# Patient Record
Sex: Female | Born: 1970
Health system: Southern US, Community
[De-identification: ages and names within clinical notes are randomized; demographics above are authoritative.]

## PROBLEM LIST (undated history)

## (undated) DIAGNOSIS — K589 Irritable bowel syndrome without diarrhea: Secondary | ICD-10-CM

## (undated) DIAGNOSIS — I10 Essential (primary) hypertension: Secondary | ICD-10-CM

## (undated) DIAGNOSIS — E079 Disorder of thyroid, unspecified: Secondary | ICD-10-CM

## (undated) HISTORY — DX: Disorder of thyroid, unspecified: E07.9

## (undated) HISTORY — PX: LEG SURGERY: SHX1003

---

## 2002-05-08 ENCOUNTER — Emergency Department (HOSPITAL_COMMUNITY): Admission: EM | Admit: 2002-05-08 | Discharge: 2002-05-08 | Payer: Self-pay | Admitting: Emergency Medicine

## 2003-04-22 ENCOUNTER — Other Ambulatory Visit: Admission: RE | Admit: 2003-04-22 | Discharge: 2003-04-22 | Payer: Self-pay | Admitting: Obstetrics and Gynecology

## 2003-09-02 ENCOUNTER — Inpatient Hospital Stay (HOSPITAL_COMMUNITY): Admission: AD | Admit: 2003-09-02 | Discharge: 2003-09-08 | Payer: Self-pay | Admitting: Obstetrics and Gynecology

## 2003-09-02 ENCOUNTER — Encounter: Payer: Self-pay | Admitting: Obstetrics and Gynecology

## 2003-09-04 ENCOUNTER — Encounter: Payer: Self-pay | Admitting: Obstetrics & Gynecology

## 2003-09-04 ENCOUNTER — Encounter (INDEPENDENT_AMBULATORY_CARE_PROVIDER_SITE_OTHER): Payer: Self-pay | Admitting: *Deleted

## 2003-10-03 ENCOUNTER — Other Ambulatory Visit: Admission: RE | Admit: 2003-10-03 | Discharge: 2003-10-03 | Payer: Self-pay | Admitting: *Deleted

## 2005-03-26 ENCOUNTER — Emergency Department (HOSPITAL_COMMUNITY): Admission: EM | Admit: 2005-03-26 | Discharge: 2005-03-27 | Payer: Self-pay | Admitting: Emergency Medicine

## 2006-03-24 ENCOUNTER — Ambulatory Visit: Payer: Self-pay | Admitting: Internal Medicine

## 2006-03-28 ENCOUNTER — Ambulatory Visit: Payer: Self-pay | Admitting: Family Medicine

## 2006-04-11 ENCOUNTER — Ambulatory Visit: Payer: Self-pay | Admitting: Family Medicine

## 2006-05-09 ENCOUNTER — Encounter (INDEPENDENT_AMBULATORY_CARE_PROVIDER_SITE_OTHER): Payer: Self-pay | Admitting: Family Medicine

## 2006-05-09 LAB — CONVERTED CEMR LAB
RBC count: 4.81 10*6/uL
TSH: 3.743 microintl units/mL
WBC, blood: 9 10*3/uL

## 2006-05-23 ENCOUNTER — Ambulatory Visit: Payer: Self-pay | Admitting: Family Medicine

## 2006-06-07 ENCOUNTER — Encounter (INDEPENDENT_AMBULATORY_CARE_PROVIDER_SITE_OTHER): Payer: Self-pay | Admitting: Family Medicine

## 2006-06-20 ENCOUNTER — Ambulatory Visit: Payer: Self-pay | Admitting: Family Medicine

## 2006-09-19 ENCOUNTER — Ambulatory Visit: Payer: Self-pay | Admitting: Family Medicine

## 2006-10-16 ENCOUNTER — Encounter: Payer: Self-pay | Admitting: Family Medicine

## 2006-10-16 DIAGNOSIS — F329 Major depressive disorder, single episode, unspecified: Secondary | ICD-10-CM | POA: Insufficient documentation

## 2006-10-16 DIAGNOSIS — E785 Hyperlipidemia, unspecified: Secondary | ICD-10-CM | POA: Insufficient documentation

## 2006-10-16 DIAGNOSIS — K5900 Constipation, unspecified: Secondary | ICD-10-CM | POA: Insufficient documentation

## 2006-10-16 DIAGNOSIS — O1410 Severe pre-eclampsia, unspecified trimester: Secondary | ICD-10-CM | POA: Insufficient documentation

## 2006-10-16 DIAGNOSIS — F411 Generalized anxiety disorder: Secondary | ICD-10-CM | POA: Insufficient documentation

## 2006-10-16 DIAGNOSIS — I10 Essential (primary) hypertension: Secondary | ICD-10-CM | POA: Insufficient documentation

## 2006-10-16 DIAGNOSIS — K589 Irritable bowel syndrome without diarrhea: Secondary | ICD-10-CM | POA: Insufficient documentation

## 2007-06-18 ENCOUNTER — Ambulatory Visit: Payer: Self-pay | Admitting: Family Medicine

## 2007-06-18 DIAGNOSIS — E663 Overweight: Secondary | ICD-10-CM | POA: Insufficient documentation

## 2007-06-18 DIAGNOSIS — I868 Varicose veins of other specified sites: Secondary | ICD-10-CM | POA: Insufficient documentation

## 2007-06-18 LAB — CONVERTED CEMR LAB
Cholesterol, target level: 200 mg/dL
HDL goal, serum: 40 mg/dL
LDL Goal: 130 mg/dL

## 2007-06-19 ENCOUNTER — Encounter (INDEPENDENT_AMBULATORY_CARE_PROVIDER_SITE_OTHER): Payer: Self-pay | Admitting: Family Medicine

## 2007-06-19 ENCOUNTER — Telehealth (INDEPENDENT_AMBULATORY_CARE_PROVIDER_SITE_OTHER): Payer: Self-pay | Admitting: *Deleted

## 2007-06-22 ENCOUNTER — Encounter (INDEPENDENT_AMBULATORY_CARE_PROVIDER_SITE_OTHER): Payer: Self-pay | Admitting: Family Medicine

## 2007-06-23 ENCOUNTER — Telehealth (INDEPENDENT_AMBULATORY_CARE_PROVIDER_SITE_OTHER): Payer: Self-pay | Admitting: *Deleted

## 2007-06-23 LAB — CONVERTED CEMR LAB
ALT: 16 units/L (ref 0–35)
AST: 18 units/L (ref 0–37)
Albumin: 4.1 g/dL (ref 3.5–5.2)
Alkaline Phosphatase: 73 units/L (ref 39–117)
BUN: 12 mg/dL (ref 6–23)
Basophils Absolute: 0 10*3/uL (ref 0.0–0.1)
Basophils Relative: 0 % (ref 0–1)
CO2: 25 meq/L (ref 19–32)
Calcium: 9 mg/dL (ref 8.4–10.5)
Chloride: 109 meq/L (ref 96–112)
Cholesterol: 159 mg/dL (ref 0–200)
Creatinine, Ser: 0.78 mg/dL (ref 0.40–1.20)
Eosinophils Absolute: 0.1 10*3/uL (ref 0.0–0.7)
Eosinophils Relative: 2 % (ref 0–5)
Glucose, Bld: 101 mg/dL — ABNORMAL HIGH (ref 70–99)
HCT: 40.9 % (ref 36.0–46.0)
HDL: 43 mg/dL (ref >39)
Hemoglobin: 13.8 g/dL (ref 12.0–15.0)
LDL Cholesterol: 93 mg/dL (ref 0–99)
Lymphocytes Relative: 33 % (ref 12–46)
Lymphs Abs: 2.2 10*3/uL (ref 0.7–3.3)
MCHC: 33.7 g/dL (ref 30.0–36.0)
MCV: 92.5 fL (ref 78.0–100.0)
Monocytes Absolute: 0.7 10*3/uL (ref 0.2–0.7)
Monocytes Relative: 11 % (ref 3–11)
Neutro Abs: 3.6 10*3/uL (ref 1.7–7.7)
Neutrophils Relative %: 54 % (ref 43–77)
Platelets: 280 10*3/uL (ref 150–400)
Potassium: 3.6 meq/L (ref 3.5–5.3)
RBC: 4.42 M/uL (ref 3.87–5.11)
RDW: 12.9 % (ref 11.5–14.0)
Sodium: 142 meq/L (ref 135–145)
TSH: 4.093 microintl units/mL (ref 0.350–5.50)
Total Bilirubin: 0.8 mg/dL (ref 0.3–1.2)
Total CHOL/HDL Ratio: 3.7
Total Protein: 7.2 g/dL (ref 6.0–8.3)
Triglycerides: 117 mg/dL (ref <150)
VLDL: 23 mg/dL (ref 0–40)
WBC: 6.7 10*3/uL (ref 4.0–10.5)

## 2007-06-25 ENCOUNTER — Encounter (INDEPENDENT_AMBULATORY_CARE_PROVIDER_SITE_OTHER): Payer: Self-pay | Admitting: Family Medicine

## 2007-07-01 ENCOUNTER — Encounter (INDEPENDENT_AMBULATORY_CARE_PROVIDER_SITE_OTHER): Payer: Self-pay | Admitting: Family Medicine

## 2007-09-08 ENCOUNTER — Ambulatory Visit: Payer: Self-pay | Admitting: Family Medicine

## 2007-11-09 ENCOUNTER — Ambulatory Visit: Payer: Self-pay | Admitting: Family Medicine

## 2007-11-09 DIAGNOSIS — M129 Arthropathy, unspecified: Secondary | ICD-10-CM | POA: Insufficient documentation

## 2007-11-11 ENCOUNTER — Encounter (INDEPENDENT_AMBULATORY_CARE_PROVIDER_SITE_OTHER): Payer: Self-pay | Admitting: Family Medicine

## 2007-11-11 LAB — CONVERTED CEMR LAB
CRP: 0.6 mg/dL — ABNORMAL HIGH (ref <0.6)
Rheumatoid fact SerPl-aCnc: <20 intl units/mL (ref 0–20)
Sed Rate: 10 mm/hr (ref 0–22)

## 2007-11-13 ENCOUNTER — Telehealth (INDEPENDENT_AMBULATORY_CARE_PROVIDER_SITE_OTHER): Payer: Self-pay | Admitting: *Deleted

## 2007-11-16 ENCOUNTER — Encounter (INDEPENDENT_AMBULATORY_CARE_PROVIDER_SITE_OTHER): Payer: Self-pay | Admitting: Family Medicine

## 2007-12-22 ENCOUNTER — Ambulatory Visit: Payer: Self-pay | Admitting: Family Medicine

## 2007-12-22 LAB — CONVERTED CEMR LAB
Bilirubin Urine: NEGATIVE
Glucose, Urine, Semiquant: NEGATIVE
Nitrite: NEGATIVE
Protein, U semiquant: NEGATIVE
Specific Gravity, Urine: 1.025
Urobilinogen, UA: 0.2
pH: 6.5

## 2008-04-19 ENCOUNTER — Ambulatory Visit: Payer: Self-pay | Admitting: Family Medicine

## 2008-04-19 LAB — CONVERTED CEMR LAB: LDL Goal: 160 mg/dL

## 2008-04-20 ENCOUNTER — Encounter (INDEPENDENT_AMBULATORY_CARE_PROVIDER_SITE_OTHER): Payer: Self-pay | Admitting: Family Medicine

## 2008-04-22 LAB — CONVERTED CEMR LAB
BUN: 8 mg/dL (ref 6–23)
Chloride: 108 meq/L (ref 96–112)
Free T4: 0.92 ng/dL (ref 0.89–1.80)
Glucose, Bld: 99 mg/dL (ref 70–99)
Lymphs Abs: 3.3 10*3/uL (ref 0.7–4.0)
Monocytes Relative: 11 % (ref 3–12)
Neutro Abs: 4.8 10*3/uL (ref 1.7–7.7)
Neutrophils Relative %: 52 % (ref 43–77)
Potassium: 3.8 meq/L (ref 3.5–5.3)
RBC: 4.85 M/uL (ref 3.87–5.11)
WBC: 9.2 10*3/uL (ref 4.0–10.5)

## 2008-05-24 ENCOUNTER — Ambulatory Visit: Payer: Self-pay | Admitting: Family Medicine

## 2008-05-25 ENCOUNTER — Encounter (INDEPENDENT_AMBULATORY_CARE_PROVIDER_SITE_OTHER): Payer: Self-pay | Admitting: Family Medicine

## 2008-06-22 ENCOUNTER — Encounter (INDEPENDENT_AMBULATORY_CARE_PROVIDER_SITE_OTHER): Payer: Self-pay | Admitting: Family Medicine

## 2008-06-22 LAB — CONVERTED CEMR LAB
BUN: 9 mg/dL (ref 6–23)
Chloride: 103 meq/L (ref 96–112)
Glucose, Bld: 128 mg/dL — ABNORMAL HIGH (ref 70–99)
Potassium: 3.5 meq/L (ref 3.5–5.3)
Sodium: 137 meq/L (ref 135–145)

## 2008-07-21 ENCOUNTER — Ambulatory Visit: Payer: Self-pay | Admitting: Family Medicine

## 2008-07-21 DIAGNOSIS — S058X9A Other injuries of unspecified eye and orbit, initial encounter: Secondary | ICD-10-CM | POA: Insufficient documentation

## 2008-08-23 ENCOUNTER — Encounter (INDEPENDENT_AMBULATORY_CARE_PROVIDER_SITE_OTHER): Payer: Self-pay | Admitting: Family Medicine

## 2008-08-24 ENCOUNTER — Encounter (INDEPENDENT_AMBULATORY_CARE_PROVIDER_SITE_OTHER): Payer: Self-pay | Admitting: Family Medicine

## 2008-08-24 LAB — CONVERTED CEMR LAB
Albumin: 4.5 g/dL (ref 3.5–5.2)
Alkaline Phosphatase: 111 units/L (ref 39–117)
BUN: 9 mg/dL (ref 6–23)
CO2: 21 meq/L (ref 19–32)
Calcium: 8.9 mg/dL (ref 8.4–10.5)
Cholesterol: 171 mg/dL (ref 0–200)
Glucose, Bld: 76 mg/dL (ref 70–99)
HDL: 52 mg/dL (ref >39)
LDL Cholesterol: 99 mg/dL (ref 0–99)
Potassium: 3.9 meq/L (ref 3.5–5.3)
Triglycerides: 101 mg/dL (ref <150)

## 2008-08-25 ENCOUNTER — Ambulatory Visit: Payer: Self-pay | Admitting: Family Medicine

## 2008-08-25 LAB — CONVERTED CEMR LAB: T3, Free: 2.9 pg/mL (ref 2.3–4.2)

## 2008-09-03 ENCOUNTER — Emergency Department (HOSPITAL_COMMUNITY): Admission: EM | Admit: 2008-09-03 | Discharge: 2008-09-03 | Payer: Self-pay | Admitting: Emergency Medicine

## 2008-09-26 ENCOUNTER — Ambulatory Visit: Payer: Self-pay | Admitting: Internal Medicine

## 2008-09-26 LAB — CONVERTED CEMR LAB
Inflenza A Ag: NEGATIVE
Influenza B Ag: NEGATIVE

## 2008-11-24 ENCOUNTER — Ambulatory Visit: Payer: Self-pay | Admitting: Family Medicine

## 2008-11-25 ENCOUNTER — Encounter (INDEPENDENT_AMBULATORY_CARE_PROVIDER_SITE_OTHER): Payer: Self-pay | Admitting: Family Medicine

## 2008-11-28 LAB — CONVERTED CEMR LAB
Chloride: 107 meq/L (ref 96–112)
Potassium: 4.2 meq/L (ref 3.5–5.3)
Sodium: 139 meq/L (ref 135–145)
TSH: 3.524 microintl units/mL (ref 0.350–4.50)

## 2008-12-07 ENCOUNTER — Encounter (INDEPENDENT_AMBULATORY_CARE_PROVIDER_SITE_OTHER): Payer: Self-pay | Admitting: Family Medicine

## 2009-03-29 ENCOUNTER — Ambulatory Visit: Payer: Self-pay | Admitting: Family Medicine

## 2009-04-19 ENCOUNTER — Encounter (INDEPENDENT_AMBULATORY_CARE_PROVIDER_SITE_OTHER): Payer: Self-pay | Admitting: Family Medicine

## 2009-07-13 ENCOUNTER — Ambulatory Visit: Payer: Self-pay | Admitting: Family Medicine

## 2009-07-13 DIAGNOSIS — J301 Allergic rhinitis due to pollen: Secondary | ICD-10-CM | POA: Insufficient documentation

## 2009-07-13 LAB — CONVERTED CEMR LAB

## 2009-07-14 ENCOUNTER — Encounter (INDEPENDENT_AMBULATORY_CARE_PROVIDER_SITE_OTHER): Payer: Self-pay | Admitting: *Deleted

## 2009-07-14 ENCOUNTER — Encounter (INDEPENDENT_AMBULATORY_CARE_PROVIDER_SITE_OTHER): Payer: Self-pay | Admitting: Family Medicine

## 2009-07-14 LAB — CONVERTED CEMR LAB
CO2: 18 meq/L — ABNORMAL LOW (ref 19–32)
Chloride: 106 meq/L (ref 96–112)
Potassium: 4.1 meq/L (ref 3.5–5.3)
Sodium: 137 meq/L (ref 135–145)

## 2009-08-01 ENCOUNTER — Encounter (INDEPENDENT_AMBULATORY_CARE_PROVIDER_SITE_OTHER): Payer: Self-pay | Admitting: Family Medicine

## 2009-08-25 ENCOUNTER — Emergency Department (HOSPITAL_COMMUNITY): Admission: EM | Admit: 2009-08-25 | Discharge: 2009-08-25 | Payer: Self-pay | Admitting: Emergency Medicine

## 2011-03-29 NOTE — Op Note (Signed)
Peggy Montgomery, Peggy Montgomery                            ACCOUNT NO.:  [id number]   MEDICAL RECORD NO.:  [id number]                   PATIENT TYPE:  INP   LOCATION:  9156                                 FACILITY:  [facility]   PHYSICIAN:  Genia Del, M.D.             DATE OF BIRTH:  March 04, 1971   DATE OF PROCEDURE:  09/04/2003  DATE OF DISCHARGE:                                 OPERATIVE REPORT   PREOPERATIVE DIAGNOSES:  28 weeks and 2 days gestation with severe HELLP,  chronic hypertension, betamethasone completed.   POSTOPERATIVE DIAGNOSES:  8 weeks and 2 days gestation with severe HELLP,  chronic hypertension, betamethasone completed.   INTERVENTION:  Urgent primary low transverse cesarean section under general  anesthesia.   ANESTHESIOLOGIST:  Octaviano Glow. Pamalee Leyden, M.D.   SURGEON:  Genia Del, M.D.   ASSISTANT:  Conni Elliot, M.D.   DESCRIPTION OF PROCEDURE:  Under general anesthesia with endotracheal  intubation, the patient is in 15 degree left decubitus position. She is  prepped with Hibiclens in the abdominal, suprapubic and vulvar areas. The  bladder catheter is already in place and the patient is draped as usual. A  Pfannenstiel incision is made with a scalpel after infiltrating the  subcutaneous tissue with Marcaine 0.25% 10 mL.  We opened the aponeurosis  transversely with Mayo scissors. We detached the aponeurosis from the recti  muscles on the midline. We opened the parietoperitoneum bluntly. The bladder  retractor is put in place and the visceral peritoneum is opened on the lower  uterine segment with the Metzenbaum scissors transversely. The bladder is  reclined downward, the  bladder retractor is reapplied. There is enough  space for a low transverse incision on the lower uterine segment so the  hysterotomy is done that way with the scalpel and prolongation on each side  with scissors is done. We have a fetus in cephalic presentation, the  membranes are  ruptured, the amniotic fluid is clear. Birth of a baby girl at  9:02. The cord is clamped and cut.  Because of swelling of the cord, it  tears when applying the clamp but no blood loss occurs for the baby. A  second clamp is applied more proximally on the baby side. The baby is given  to the neonatal team, Apgar are 1&7, pH is 7.22 with pCO2 at 73, bicarb is  at 28.9 and base deficit at -0.9.  Cord blood is also taken. We then  evacuate the placenta spontaneously, it is sent to pathology. Uterine  revision is done. The uterus contracts poorly at first. Pitocin 40 units per  liter is started. We also inject Pitocin in the myometrium and give one dose  of Hemabate. This successfully makes the uterus contract well. We close the  hysterotomy in a locked running suture of #0 Vicryl. We make another plane  in a mattress fashion with #0 Vicryl. Hemostasis is completed  with two  __________ stitches of #0 Vicryl at the left angle. Hemostasis is adequate.  The two ovaries appear normal in size and shape. The two tubes are normal.  We reposition the uterus in the abdominal cavity as it was exteriorized for  repair. We irrigate and suction the abdominopelvic cavity, all blood clots  are removed. Hemostasis is verified once more on the lower uterine segment  at the hysterotomy site and is adequate. Hemostasis is verified in the  aponeurosis and recti muscles and bladder flap. It is completed where  necessary with the electrocautery. We then closed the aponeurosis with two  half running sutures of #0 Vicryl. We verified hemostasis in the adipose  tissue, this completed with the electrocautery. We irrigated that level. The  skin is then reapproximated with staples, a dry dressing is applied. Counts  of sponges and instruments was complete x2. The estimated blood loss 800 mL.  Fluids, IV 500 mL, urine  output 200 mL.  Ancef 1 g IV was given and as mentioned above, Pitocin and  Hemabate were given. No  complication occurred and the patient was  transferred to recovery room in good status. She will be continued on  magnesium sulfate for at least 24 hours postop or until good diuresis  occurs. PIH labs will be done serially.                                               Genia Del, M.D.    ML/MEDQ  D:  09/04/2003  T:  09/04/2003  Job:  [id number]

## 2011-03-29 NOTE — Discharge Summary (Signed)
NAMEISLAY, POLANCO                            ACCOUNT NO.:  [id number]   MEDICAL RECORD NO.:  [id number]                   PATIENT TYPE:  INP   LOCATION:  9139                                 FACILITY:  [facility]   PHYSICIAN:  Genia Del, M.D.             DATE OF BIRTH:  1971-07-05   DATE OF ADMISSION:  09/02/2003  DATE OF DISCHARGE:  09/08/2003                                 DISCHARGE SUMMARY   PROCEDURE:  Urgent primary low transverse C-section on September 04, 2003,  birth of a baby girl at 28 weeks and 2 days.   HOSPITAL COURSE:  Mrs. Solorzano was admitted on September 02, 2003 at 28 weeks.  She is a 40 year old G1.  At the time of admission she was complaining of  right upper quadrant pain and she was on labetalol for gestational  hypertension.  Her blood pressures on admission were 157-170s/85-98 and her  PIH labs showed an LVH at 463, ALT at 137 and AST at 81, platelets were 167  and white blood cells 15.9.  A 24-hour urine was started, the fetal well  being was reassuring, and the most probable diagnosis was severe  preeclampsia.  The patient was started on magnesium sulfate and also on  penicillin G pending her group B strep.  She received betamethasone x2  doses.  She clinically got better on magnesium sulfate and even her lab  results showed improvement until September 04, 2003 when her right upper  quadrant pain came back, her blood pressures went very high up to 180/124  and at that time her [facility] labs started deteriorating rapidly.  Her platelets  were at 71, AST 664, ALT 503, and LDH at 1467.  Given the diagnosis of HELLP  progressing rapidly now at 28 weeks and 2 days with betamethasone already  given the decision was made to proceed with urgent C-section so an urgent  primary low transverse C-section was performed under general anesthesia.  She had a baby girl at 9:02 on September 04, 2003.  The neonatal team was  present, Apgars were 1 and 7, pH was 7.22, base deficit was  -0.9.  The  estimated blood loss was 800 mL, urine output 200 mL during the surgery,  Ancef 1 g IV was given and the patient received Pitocin _____ and Hemabate  to help the uterus contract after extraction of the placenta.  The postop  evolution was remarkable for a drastic drop of her platelets down to less  than 10,000 which gradually improved on dexamethasone.  All her PIH labs  slowly improved in the postop period and her blood pressures as well.  She  stayed on magnesium sulfate until she had a good diuresis.  She was  discharged home on postop day #4 in good status with resolving HELLP.  She  was given postop advice and preeclampsia and HELLP precautions.  She was  given Tylox p.r.n.  and will follow at [facility] office with a repeat  CBC and differential and blood pressure check within a week.                                               Genia Del, M.D.    ML/MEDQ  D:  09/28/2003  T:  09/29/2003  Job:  [id number]

## 2011-07-15 ENCOUNTER — Observation Stay (HOSPITAL_COMMUNITY)
Admission: EM | Admit: 2011-07-15 | Discharge: 2011-07-16 | Disposition: A | Discharge status: Home / Self Care | Payer: Worker's Compensation | Attending: Emergency Medicine | Admitting: Emergency Medicine

## 2011-07-15 DIAGNOSIS — I10 Essential (primary) hypertension: Secondary | ICD-10-CM | POA: Insufficient documentation

## 2011-07-15 DIAGNOSIS — R079 Chest pain, unspecified: Principal | ICD-10-CM | POA: Insufficient documentation

## 2011-07-15 HISTORY — DX: Essential (primary) hypertension: I10

## 2011-07-15 LAB — POCT I-STAT, CHEM 8
BUN: 9 mg/dL (ref 6–23)
Calcium, Ion: 1.12 mmol/L (ref 1.12–1.32)
Chloride: 105 mEq/L (ref 96–112)
Creatinine, Ser: 0.7 mg/dL (ref 0.50–1.10)
Glucose, Bld: 95 mg/dL (ref 70–99)
Potassium: 3.2 mEq/L — ABNORMAL LOW (ref 3.5–5.1)

## 2011-07-16 ENCOUNTER — Observation Stay (HOSPITAL_COMMUNITY): Payer: Worker's Compensation

## 2011-07-16 ENCOUNTER — Encounter (HOSPITAL_COMMUNITY): Payer: Self-pay | Admitting: Radiology

## 2011-07-16 LAB — POCT I-STAT TROPONIN I
Troponin i, poc: 0.01 ng/mL (ref 0.00–0.08)
Troponin i, poc: 0.01 ng/mL (ref 0.00–0.08)
Troponin i, poc: 0 ng/mL (ref 0.00–0.08)

## 2011-07-16 MED ORDER — IOHEXOL 350 MG/ML SOLN
80.0000 mL | Freq: Once | INTRAVENOUS | Status: AC | PRN
  Administered 2011-07-16: 80 mL via INTRAVENOUS

## 2012-08-07 ENCOUNTER — Other Ambulatory Visit (HOSPITAL_COMMUNITY): Payer: Self-pay | Admitting: Physician Assistant

## 2012-08-07 DIAGNOSIS — E039 Hypothyroidism, unspecified: Secondary | ICD-10-CM

## 2012-08-10 ENCOUNTER — Ambulatory Visit (HOSPITAL_COMMUNITY)
Admission: RE | Admit: 2012-08-10 | Discharge: 2012-08-10 | Disposition: A | Discharge status: Home / Self Care | Payer: BC Managed Care – PPO | Source: Ambulatory Visit | Attending: Physician Assistant | Admitting: Physician Assistant

## 2012-08-10 DIAGNOSIS — E049 Nontoxic goiter, unspecified: Secondary | ICD-10-CM | POA: Insufficient documentation

## 2012-08-10 DIAGNOSIS — E039 Hypothyroidism, unspecified: Secondary | ICD-10-CM

## 2013-10-25 ENCOUNTER — Emergency Department (HOSPITAL_COMMUNITY): Payer: BC Managed Care – PPO

## 2013-10-25 ENCOUNTER — Emergency Department (HOSPITAL_COMMUNITY)
Admission: EM | Admit: 2013-10-25 | Discharge: 2013-10-26 | Disposition: A | Discharge status: Home / Self Care | Payer: BC Managed Care – PPO | Attending: Emergency Medicine | Admitting: Emergency Medicine

## 2013-10-25 ENCOUNTER — Encounter (HOSPITAL_COMMUNITY): Payer: Self-pay | Admitting: Emergency Medicine

## 2013-10-25 DIAGNOSIS — R42 Dizziness and giddiness: Secondary | ICD-10-CM | POA: Insufficient documentation

## 2013-10-25 DIAGNOSIS — Z87891 Personal history of nicotine dependence: Secondary | ICD-10-CM | POA: Insufficient documentation

## 2013-10-25 DIAGNOSIS — H9319 Tinnitus, unspecified ear: Secondary | ICD-10-CM | POA: Insufficient documentation

## 2013-10-25 DIAGNOSIS — Z8719 Personal history of other diseases of the digestive system: Secondary | ICD-10-CM | POA: Insufficient documentation

## 2013-10-25 DIAGNOSIS — I10 Essential (primary) hypertension: Secondary | ICD-10-CM | POA: Insufficient documentation

## 2013-10-25 DIAGNOSIS — K5289 Other specified noninfective gastroenteritis and colitis: Secondary | ICD-10-CM | POA: Insufficient documentation

## 2013-10-25 DIAGNOSIS — Z79899 Other long term (current) drug therapy: Secondary | ICD-10-CM | POA: Insufficient documentation

## 2013-10-25 DIAGNOSIS — K529 Noninfective gastroenteritis and colitis, unspecified: Secondary | ICD-10-CM

## 2013-10-25 DIAGNOSIS — R209 Unspecified disturbances of skin sensation: Secondary | ICD-10-CM | POA: Insufficient documentation

## 2013-10-25 HISTORY — DX: Irritable bowel syndrome, unspecified: K58.9

## 2013-10-25 LAB — CBC WITH DIFFERENTIAL/PLATELET
Basophils Absolute: 0 10*3/uL (ref 0.0–0.1)
Basophils Relative: 0 % (ref 0–1)
Eosinophils Absolute: 0.1 10*3/uL (ref 0.0–0.7)
MCH: 31.3 pg (ref 26.0–34.0)
MCHC: 34.1 g/dL (ref 30.0–36.0)
Monocytes Relative: 8 % (ref 3–12)
Neutro Abs: 8.5 10*3/uL — ABNORMAL HIGH (ref 1.7–7.7)
Neutrophils Relative %: 71 % (ref 43–77)
RDW: 12.5 % (ref 11.5–15.5)

## 2013-10-25 LAB — BASIC METABOLIC PANEL
BUN: 5 mg/dL — ABNORMAL LOW (ref 6–23)
Chloride: 98 mEq/L (ref 96–112)
Creatinine, Ser: 0.8 mg/dL (ref 0.50–1.10)
GFR calc Af Amer: >90 mL/min (ref >90)
GFR calc non Af Amer: 88 mL/min — ABNORMAL LOW (ref >90)
Potassium: 3.1 mEq/L — ABNORMAL LOW (ref 3.5–5.1)

## 2013-10-25 MED ORDER — KETOROLAC TROMETHAMINE 30 MG/ML IJ SOLN
30.0000 mg | Freq: Once | INTRAMUSCULAR | Status: AC
  Administered 2013-10-25: 30 mg via INTRAVENOUS
  Filled 2013-10-25: qty 1

## 2013-10-25 MED ORDER — POTASSIUM CHLORIDE CRYS ER 20 MEQ PO TBCR
40.0000 meq | EXTENDED_RELEASE_TABLET | Freq: Once | ORAL | Status: AC
  Administered 2013-10-26: 40 meq via ORAL
  Filled 2013-10-25: qty 2

## 2013-10-25 MED ORDER — CIPROFLOXACIN HCL 500 MG PO TABS: 500.0000 mg | ORAL_TABLET | Freq: Two times a day (BID) | ORAL | Status: DC

## 2013-10-25 MED ORDER — SODIUM CHLORIDE 0.9 % IV BOLUS (SEPSIS)
1000.0000 mL | Freq: Once | INTRAVENOUS | Status: AC
  Administered 2013-10-25: 1000 mL via INTRAVENOUS

## 2013-10-25 MED ORDER — CIPROFLOXACIN HCL 250 MG PO TABS
500.0000 mg | ORAL_TABLET | Freq: Once | ORAL | Status: AC
  Administered 2013-10-26: 500 mg via ORAL
  Filled 2013-10-25: qty 2

## 2013-10-25 MED ORDER — DICYCLOMINE HCL 20 MG PO TABS: ORAL_TABLET | ORAL | Status: DC

## 2013-10-25 NOTE — ED Provider Notes (Signed)
CSN: [id number]     Arrival date & time 10/25/13  1905 History   First MD Initiated Contact with Patient 10/25/13 2150     This chart was scribed for Benny Lennert, MD by Arlan Organ, ED Scribe. This patient was seen in room APA18/APA18 and the patient's care was started 9:55 PM.   Chief Complaint  Patient presents with  . GI Bleeding   Patient is a 42 y.o. female presenting with diarrhea. The history is provided by the patient. No language interpreter was used.  Diarrhea Severity:  Severe Onset quality:  Gradual Duration:  1 day Timing:  Constant Progression:  Unchanged Relieved by:  None tried Worsened by:  Nothing tried Ineffective treatments:  None tried Associated symptoms: abdominal pain   Associated symptoms: no headaches   Abdominal pain:    Location:  Generalized   Severity:  Moderate   Onset quality:  Gradual   Duration:  1 day   Timing:  Constant   Progression:  Unchanged   Chronicity:  New   HPI Comments: Nelsy Vokes is a 42 y.o. female who presents to the Emergency Department complaining of gradual onset, unchanged, constant diarrhea that started yesterday around 7:30 PM. She also reports associated abdominal pain. Pt states she experienced tinnitus, numbness of her upper lip, and dizziness with her first episode of diarrhea. She says going forward she recalls numerous episodes of regular diarrhea without the previous symptoms. Pt states her diarrhea persisted today, but consisted of mucous and blood. She denies any sick contacts. She denies fever or emesis.   Past Medical History  Diagnosis Date  . Hypertension   . IBS (irritable bowel syndrome)    Past Surgical History  Procedure Laterality Date  . Cesarean section     History reviewed. No pertinent family history. History  Substance Use Topics  . Smoking status: Former Games developer  . Smokeless tobacco: Not on file  . Alcohol Use: No   OB History   Grav Para Term Preterm Abortions TAB SAB Ect Mult  Living                 Review of Systems  Constitutional: Negative for appetite change and fatigue.  HENT: Negative for congestion, ear discharge and sinus pressure.   Eyes: Negative for discharge.  Respiratory: Negative for cough.   Cardiovascular: Negative for chest pain.  Gastrointestinal: Positive for abdominal pain and diarrhea.  Genitourinary: Negative for frequency and hematuria.  Musculoskeletal: Negative for back pain.  Skin: Negative for rash.  Neurological: Negative for seizures and headaches.  Psychiatric/Behavioral: Negative for hallucinations.    Allergies  Review of patient's allergies indicates no known allergies.  Home Medications   Current Outpatient Rx  Name  Route  Sig  Dispense  Refill  . Escitalopram Oxalate (LEXAPRO PO)   Oral   Take by mouth daily.         Marland Kitchen lisinopril (PRINIVIL,ZESTRIL) 20 MG tablet   Oral   Take 20 mg by mouth daily.          Triage Vitals: BP 151/82  Pulse 64  Temp(Src) 98.4 F (36.9 C) (Oral)  Resp 20  Ht 5' 9.5" (1.765 m)  Wt 200 lb (90.719 kg)  BMI 29.12 kg/m2  SpO2 96%  LMP 10/11/2013  Physical Exam  Constitutional: She is oriented to person, place, and time. She appears well-developed.  HENT:  Head: Normocephalic.  Eyes: Conjunctivae and EOM are normal. No scleral icterus.  Neck: Neck supple. No thyromegaly  present.  Cardiovascular: Normal rate and regular rhythm.  Exam reveals no gallop and no friction rub.   No murmur heard. Pulmonary/Chest: No stridor. She has no wheezes. She has no rales. She exhibits no tenderness.  Abdominal: She exhibits no distension. There is tenderness. There is no rebound.  Mild tenderness all throughout abdomen   Musculoskeletal: Normal range of motion. She exhibits no edema.  Lymphadenopathy:    She has no cervical adenopathy.  Neurological: She is oriented to person, place, and time. She exhibits normal muscle tone. Coordination normal.  Skin: No rash noted. No erythema.   Psychiatric: She has a normal mood and affect. Her behavior is normal.    ED Course  Procedures (including critical care time)  DIAGNOSTIC STUDIES: Oxygen Saturation is 96% on RA, adequate by my interpretation.    COORDINATION OF CARE: 10:05 PM-Will order X-Ray and blood panel. Will give Toradol. Discussed treatment plan with pt at bedside and pt agreed to plan.      Labs Review Labs Reviewed  CBC WITH DIFFERENTIAL - Abnormal; Notable for the following:    WBC 12.1 (*)    Neutro Abs 8.5 (*)    All other components within normal limits  BASIC METABOLIC PANEL - Abnormal; Notable for the following:    Potassium 3.1 (*)    Glucose, Bld 113 (*)    BUN 5 (*)    GFR calc non Af Amer 88 (*)    All other components within normal limits   Imaging Review No results found.  EKG Interpretation   None       MDM  Colitis,  tx with cipro         The chart was scribed for me under my direct supervision.  I personally performed the history, physical, and medical decision making and all procedures in the evaluation of this patient.Benny Lennert, MD 10/25/13 [phone]

## 2013-10-25 NOTE — ED Notes (Signed)
Pt with diarrhea last night and today diarrhea became mucus and blood in stool, hx of IBS; +nausea at times but denies vomiting

## 2013-10-25 NOTE — ED Notes (Signed)
Pt has not notice blood on panties, not having to wear a pad. Only seen bright red blood in commode after bowel movement.

## 2013-10-25 NOTE — ED Notes (Signed)
Assisted patient to room 18.  Gave her a gown, advised her to undress completely and put on gown with opening in the back.  Gave her a sheet to cover up with.  Placed the call bell within her reach and gave instructions on use.

## 2013-10-26 NOTE — ED Notes (Signed)
Patient given discharge instruction, verbalized understand. IV removed, band aid applied. Patient ambulatory out of the department.  

## 2017-03-31 DIAGNOSIS — R209 Unspecified disturbances of skin sensation: Secondary | ICD-10-CM | POA: Diagnosis not present

## 2017-03-31 DIAGNOSIS — I10 Essential (primary) hypertension: Secondary | ICD-10-CM | POA: Diagnosis not present

## 2017-04-21 ENCOUNTER — Encounter: Payer: Self-pay | Admitting: Neurology

## 2017-04-21 ENCOUNTER — Encounter (INDEPENDENT_AMBULATORY_CARE_PROVIDER_SITE_OTHER): Payer: Self-pay

## 2017-04-21 ENCOUNTER — Ambulatory Visit (INDEPENDENT_AMBULATORY_CARE_PROVIDER_SITE_OTHER): Payer: BLUE CROSS/BLUE SHIELD | Admitting: Neurology

## 2017-04-21 VITALS — BP 151/88 | HR 65 | Ht 69.0 in | Wt 188.0 lb

## 2017-04-21 DIAGNOSIS — Z5181 Encounter for therapeutic drug level monitoring: Secondary | ICD-10-CM | POA: Diagnosis not present

## 2017-04-21 DIAGNOSIS — R202 Paresthesia of skin: Secondary | ICD-10-CM

## 2017-04-21 NOTE — Progress Notes (Signed)
Reason for visit: Left-sided numbness  Referring physician: Dr. Annitta Needsaniel  Peggy Montgomery is a 46 y.o. female  History of present illness:  Ms. Peggy Montgomery is a 46 year old right-handed white female with a history of onset of paresthesias and numbness affecting the left side the body that began on 03/28/2017. The patient indicates that the episodes are occurring multiple times during the day lasting 30-45 seconds, the longest episode lasted only 3 or 4 minutes. Between events, she feels completely normal. The patient claims that the onset of these symptoms usually begins in the left shoulder and then spreads up the neck to the lower face including the gums. The patient will then have numbness throughout the entire left side of the body. She denies any weakness or clumsiness, she denies speech changes or visual field changes. She may feel slightly dizzy with the events. She denies any headaches. There is no prior history of migraine headache. She denies any confusion or loss of consciousness. Within the last 2 weeks, the episodes may skip a day on occasion. The patient denies neck pain. She is sent to this office for an evaluation.  Past Medical History:  Diagnosis Date  . Hypertension   . IBS (irritable bowel syndrome)     Past Surgical History:  Procedure Laterality Date  . CESAREAN SECTION    . LEG SURGERY      Family History  Problem Relation Age of Onset  . Thyroid disease Mother   . High blood pressure Mother   . COPD Mother   . Esophageal cancer Father     Social history:  reports that she has quit smoking. She has never used smokeless tobacco. She reports that she drinks alcohol. She reports that she does not use drugs.  Medications:  Prior to Admission medications   Medication Sig Start Date End Date Taking? Authorizing Provider  dicyclomine (BENTYL) 20 MG tablet Take one every 6-8 hours for abdominal cramps Patient taking differently: as needed. Take one every 6-8 hours for  abdominal cramps 10/25/13  Yes Bethann BerkshireZammit, Joseph, MD  Levothyroxine Sodium (SYNTHROID PO) Take 1 tablet by mouth daily.   Yes [provider]  lisinopril (PRINIVIL,ZESTRIL) 20 MG tablet Take 20 mg by mouth daily.   Yes [provider]     No Known Allergies  ROS:  Out of a complete 14 system review of symptoms, the patient complains only of the following symptoms, and all other reviewed systems are negative.  Palpitations of the heart, swelling in the legs Joint pain Numbness, dizziness Racing thoughts  Blood pressure (!) 151/88, pulse 65, height 5\' 9"  (1.753 m), weight 188 lb (85.3 kg).  Physical Exam  General: The patient is alert and cooperative at the time of the examination.  Eyes: Pupils are equal, round, and reactive to light. Discs are flat bilaterally.  Neck: The neck is supple, no carotid bruits are noted.  Respiratory: The respiratory examination is clear.  Cardiovascular: The cardiovascular examination reveals a regular rate and rhythm, no obvious murmurs or rubs are noted.  Skin: Extremities are without significant edema.  Neurologic Exam  Mental status: The patient is alert and oriented x 3 at the time of the examination. The patient has apparent normal recent and remote memory, with an apparently normal attention span and concentration ability.  Cranial nerves: Facial symmetry is present. There is good sensation of the face to pinprick and soft touch bilaterally. The strength of the facial muscles and the muscles to head turning and  shoulder shrug are normal bilaterally. Speech is well enunciated, no aphasia or dysarthria is noted. Extraocular movements are full. Visual fields are full. The tongue is midline, and the patient has symmetric elevation of the soft palate. No obvious hearing deficits are noted.  Motor: The motor testing reveals 5 over 5 strength of all 4 extremities. Good symmetric motor tone is noted throughout.  Sensory: Sensory  testing is intact to pinprick, soft touch, vibration sensation, and position sense on all 4 extremities. No evidence of extinction is noted.  Coordination: Cerebellar testing reveals good finger-nose-finger and heel-to-shin bilaterally.  Gait and station: Gait is normal. Tandem gait is normal. Romberg is negative. No drift is seen.  Reflexes: Deep tendon reflexes are symmetric and normal bilaterally. Toes are downgoing bilaterally.   Assessment/Plan:  1. Intermittent left-sided numbness  The clinical examination today is normal. The patient will undergo further workup, she will start aspirin 81 mg daily. She will undergo MRI of the brain with and without gadolinium enhancement, and a carotid Doppler study. If the above studies are unremarkable, an EEG study will be done. If the episode comes on and does not improve after several minutes, she is to call 911 and go to the emergency room. She will follow-up in 2 months.  Marlan Palau MD 04/21/2017 8:35 AM  [facility] [address] [place], [place] [address]  Phone [phone] Fax [phone]

## 2017-04-21 NOTE — Patient Instructions (Signed)
We will get MRI of the brain and get a carotid doppler study. 

## 2017-04-23 ENCOUNTER — Ambulatory Visit (INDEPENDENT_AMBULATORY_CARE_PROVIDER_SITE_OTHER): Payer: BLUE CROSS/BLUE SHIELD

## 2017-04-23 ENCOUNTER — Telehealth: Payer: Self-pay | Admitting: *Deleted

## 2017-04-23 DIAGNOSIS — R202 Paresthesia of skin: Secondary | ICD-10-CM | POA: Diagnosis not present

## 2017-04-23 LAB — COMPREHENSIVE METABOLIC PANEL
ALBUMIN: 4.5 g/dL (ref 3.5–5.5)
ALK PHOS: 106 IU/L (ref 39–117)
ALT: 12 IU/L (ref 0–32)
AST: 18 IU/L (ref 0–40)
Albumin/Globulin Ratio: 1.7 (ref 1.2–2.2)
BUN / CREAT RATIO: 13 (ref 9–23)
BUN: 10 mg/dL (ref 6–24)
Bilirubin Total: 0.9 mg/dL (ref 0.0–1.2)
CO2: 22 mmol/L (ref 20–29)
CREATININE: 0.79 mg/dL (ref 0.57–1.00)
Calcium: 9.2 mg/dL (ref 8.7–10.2)
Chloride: 102 mmol/L (ref 96–106)
GFR calc Af Amer: 102 mL/min/{1.73_m2} (ref >59)
GFR, EST NON AFRICAN AMERICAN: 89 mL/min/{1.73_m2} (ref >59)
GLOBULIN, TOTAL: 2.7 g/dL (ref 1.5–4.5)
Glucose: 129 mg/dL — ABNORMAL HIGH (ref 65–99)
Potassium: 4 mmol/L (ref 3.5–5.2)
SODIUM: 141 mmol/L (ref 134–144)
Total Protein: 7.2 g/dL (ref 6.0–8.5)

## 2017-04-23 LAB — SEDIMENTATION RATE: SED RATE: 2 mm/h (ref 0–32)

## 2017-04-23 LAB — B. BURGDORFI ANTIBODIES: Lyme IgG/IgM Ab: <0.91 {ISR} (ref 0.00–0.90)

## 2017-04-23 LAB — ANA W/REFLEX: Anti Nuclear Antibody(ANA): NEGATIVE

## 2017-04-23 MED ORDER — GADOPENTETATE DIMEGLUMINE 469.01 MG/ML IV SOLN: 15.0000 mL | Freq: Once | INTRAVENOUS | Status: AC | PRN

## 2017-04-23 NOTE — Telephone Encounter (Signed)
Called and spoke with patient about unremarkable labs per CW,MD note. Patient verbalized understanding.  

## 2017-04-23 NOTE — Telephone Encounter (Signed)
-----   Message from York Spanielharles K Willis, MD sent at 04/23/2017  7:15 AM EDT -----   The blood work results are unremarkable. Please call the patient.  ----- Message ----- From: Nell RangeInterface, [facility] Lab Results In Sent: 04/22/2017   7:42 AM To: York Spanielharles K Willis, MD

## 2017-04-25 ENCOUNTER — Telehealth: Payer: Self-pay | Admitting: Neurology

## 2017-04-25 NOTE — Telephone Encounter (Signed)
I called patient. MRI the brain shows mild white matter changes, nonspecific small white matter abnormalities. Changes are not typical for multiple sclerosis, but this cannot be fully excluded. The patient does have a history of hypertension, she was hypertensive when she was in the office. She is now on low-dose aspirin. These changes may be related to high blood pressure.  We will do carotid Doppler study, if the episodes of paresthesias continue, we may consider lumbar puncture in the future.   MRI brain 04/24/17:  IMPRESSION:  Abnormal MRI brain (with and without) demonstrating; 1. Multiple round subcortical and juxtacortical foci of T2 hyperintensities. These findings are non-specific and considerations include autoimmune, inflammatory, post-infectious, microvascular ischemic or migraine associated etiologies.  2. No abnormal lesions are seen on post contrast views.   3. No acute findings.

## 2017-05-19 ENCOUNTER — Ambulatory Visit (HOSPITAL_COMMUNITY): Payer: BLUE CROSS/BLUE SHIELD

## 2017-07-14 NOTE — Progress Notes (Deleted)
[facility]  PATIENT: Peggy Montgomery DOB: 12-16-1970   REASON FOR VISIT: *** HISTORY FROM:    HISTORY OF PRESENT ILLNESS:Ms. Matt is a 46 year old right-handed white female with a history of onset of paresthesias and numbness affecting the left side the body that began on 03/28/2017. The patient indicates that the episodes are occurring multiple times during the day lasting 30-45 seconds, the longest episode lasted only 3 or 4 minutes. Between events, she feels completely normal. The patient claims that the onset of these symptoms usually begins in the left shoulder and then spreads up the neck to the lower face including the gums. The patient will then have numbness throughout the entire left side of the body. She denies any weakness or clumsiness, she denies speech changes or visual field changes. She may feel slightly dizzy with the events. She denies any headaches. There is no prior history of migraine headache. She denies any confusion or loss of consciousness. Within the last 2 weeks, the episodes may skip a day on occasion. The patient denies neck pain   REVIEW OF SYSTEMS: Full 14 system review of systems performed and notable only for those listed, all others are neg:  Constitutional: neg  Cardiovascular: neg Ear/Nose/Throat: neg  Skin: neg Eyes: neg Respiratory: neg Gastroitestinal: neg  Hematology/Lymphatic: neg  Endocrine: neg Musculoskeletal:neg Allergy/Immunology: neg Neurological: neg Psychiatric: neg Sleep : neg   ALLERGIES: No Known Allergies  HOME MEDICATIONS: Outpatient Medications Prior to Visit  Medication Sig Dispense Refill  . dicyclomine (BENTYL) 20 MG tablet Take one every 6-8 hours for abdominal cramps (Patient taking differently: as needed. Take one every 6-8 hours for abdominal cramps) 15 tablet 0  . Levothyroxine Sodium (SYNTHROID PO) Take 1 tablet by mouth daily.    Marland Kitchen lisinopril (PRINIVIL,ZESTRIL) 20 MG tablet Take 20 mg by  mouth daily.     Facility-Administered Medications Prior to Visit  Medication Dose Route Frequency Provider Last Rate Last Dose  . gadopentetate dimeglumine (MAGNEVIST) injection 15 mL  15 mL Intravenous Once PRN Penumalli, Glenford Bayley, MD        PAST MEDICAL HISTORY: Past Medical History:  Diagnosis Date  . Hypertension   . IBS (irritable bowel syndrome)     PAST SURGICAL HISTORY: Past Surgical History:  Procedure Laterality Date  . CESAREAN SECTION    . LEG SURGERY      FAMILY HISTORY: Family History  Problem Relation Age of Onset  . Thyroid disease Mother   . High blood pressure Mother   . COPD Mother   . Esophageal cancer Father     SOCIAL HISTORY: Social History   Social History  . Marital status: Married    Spouse name: Jonny Ruiz  . Number of children: 1  . Years of education: 64   Occupational History  . Not on file.   Social History Main Topics  . Smoking status: Former Games developer  . Smokeless tobacco: Never Used  . Alcohol use Yes     Comment: rare  . Drug use: No  . Sexual activity: Not on file   Other Topics Concern  . Not on file   Social History Narrative   Lives with husband, Jonny Ruiz   Caffeine use: Drinks coffee daily   Sweet tea- rare   Right handed      PHYSICAL EXAM  There were no vitals filed for this visit. There is no height or weight on file to calculate BMI.  Generalized: Well developed, in no acute distress  Head: normocephalic and atraumatic,. Oropharynx benign  Neck: Supple, no carotid bruits  Cardiac: Regular rate rhythm, no murmur  Musculoskeletal: No deformity   Neurological examination   Mentation: Alert oriented to time, place, history taking. Attention span and concentration appropriate. Recent and remote memory intact.  Follows all commands speech and language fluent.   Cranial nerve II-XII: Fundoscopic exam reveals sharp disc margins.Pupils were equal round reactive to light extraocular movements were full, visual field  were full on confrontational test. Facial sensation and strength were normal. hearing was intact to finger rubbing bilaterally. Uvula tongue midline. head turning and shoulder shrug were normal and symmetric.Tongue protrusion into cheek strength was normal. Motor: normal bulk and tone, full strength in the BUE, BLE, fine finger movements normal, no pronator drift. No focal weakness Sensory: normal and symmetric to light touch, pinprick, and  Vibration, proprioception  Coordination: finger-nose-finger, heel-to-shin bilaterally, no dysmetria Reflexes: Brachioradialis 2/2, biceps 2/2, triceps 2/2, patellar 2/2, Achilles 2/2, plantar responses were flexor bilaterally. Gait and Station: Rising up from seated position without assistance, normal stance,  moderate stride, good arm swing, smooth turning, able to perform tiptoe, and heel walking without difficulty. Tandem gait is steady  DIAGNOSTIC DATA (LABS, IMAGING, TESTING) - I reviewed patient records, labs, notes, testing and imaging myself where available.  Lab Results  Component Value Date   WBC 12.1 (H) 10/25/2013   HGB 14.8 10/25/2013   HCT 43.4 10/25/2013   MCV 91.8 10/25/2013   PLT 268 10/25/2013      Component Value Date/Time   NA 141 04/21/2017 0853   K 4.0 04/21/2017 0853   CL 102 04/21/2017 0853   CO2 22 04/21/2017 0853   GLUCOSE 129 (H) 04/21/2017 0853   GLUCOSE 113 (H) 10/25/2013 2118   BUN 10 04/21/2017 0853   CREATININE 0.79 04/21/2017 0853   CALCIUM 9.2 04/21/2017 0853   PROT 7.2 04/21/2017 0853   ALBUMIN 4.5 04/21/2017 0853   AST 18 04/21/2017 0853   ALT 12 04/21/2017 0853   ALKPHOS 106 04/21/2017 0853   BILITOT 0.9 04/21/2017 0853   GFRNONAA 89 04/21/2017 0853   GFRAA 102 04/21/2017 0853   Lab Results  Component Value Date   CHOL 171 08/23/2008   HDL 52 08/23/2008   LDLCALC 99 08/23/2008   TRIG 101 08/23/2008   CHOLHDL 3.3 Ratio 08/23/2008   No results found for: HGBA1C No results found for: VITAMINB12 Lab  Results  Component Value Date   TSH 3.524 11/25/2008      ASSESSMENT AND PLAN  46 y.o. year old female  has a past medical history of Hypertension and IBS (irritable bowel syndrome). here with ***  Intermittent left-sided numbness  The clinical examination today is normal. The patient will undergo further workup, she will start aspirin 81 mg daily. She will undergo MRI of the brain with and without gadolinium enhancement, and a carotid Doppler study. If the above studies are unremarkable, an EEG study will be done. If the episode comes on and does not improve after several minutes, she is to call 911 and go to the emergency room. She will follow-up in 2 months  Nilda RiggsNancy Carolyn Mililani Murthy, [facility] CenterGNP, [facility] ChildrenBC, APRN  [facility] [address], [address] [place], [place] [address] [phone]

## 2017-07-15 ENCOUNTER — Ambulatory Visit: Payer: BLUE CROSS/BLUE SHIELD | Admitting: Nurse Practitioner

## 2017-07-16 ENCOUNTER — Encounter: Payer: Self-pay | Admitting: Nurse Practitioner

## 2017-10-24 DIAGNOSIS — Z1231 Encounter for screening mammogram for malignant neoplasm of breast: Secondary | ICD-10-CM | POA: Diagnosis not present

## 2017-12-04 ENCOUNTER — Encounter: Payer: Self-pay | Admitting: Obstetrics & Gynecology

## 2017-12-04 ENCOUNTER — Ambulatory Visit (INDEPENDENT_AMBULATORY_CARE_PROVIDER_SITE_OTHER): Payer: BLUE CROSS/BLUE SHIELD | Admitting: Obstetrics & Gynecology

## 2017-12-04 VITALS — BP 160/86 | Ht 68.0 in | Wt 179.0 lb

## 2017-12-04 DIAGNOSIS — Z1151 Encounter for screening for human papillomavirus (HPV): Secondary | ICD-10-CM

## 2017-12-04 DIAGNOSIS — Z01419 Encounter for gynecological examination (general) (routine) without abnormal findings: Secondary | ICD-10-CM

## 2017-12-04 DIAGNOSIS — Z30011 Encounter for initial prescription of contraceptive pills: Secondary | ICD-10-CM

## 2017-12-04 MED ORDER — NORETHINDRONE 0.35 MG PO TABS: 1.0000 | ORAL_TABLET | Freq: Every day | ORAL | 4 refills | Status: DC

## 2017-12-04 NOTE — Addendum Note (Signed)
Addended by: Berna SpareASTILLO, Phylis Javed A on: 12/04/2017 10:22 AM   Modules accepted: Orders

## 2017-12-04 NOTE — Patient Instructions (Signed)
1. Encounter for routine gynecological examination with Papanicolaou smear of cervix Normal gynecologic exam.  Pap test with high risk HPV done.  Breast exam normal.  Will obtain report of screening mammogram done December 2018.  Health labs done with family physician December 2018.  Patient is doing regular physical activity and is mindful of eating a lower carb nutrition.  Body mass index 27.22.  2. Encounter for initial prescription of contraceptive pills Menstrual periods are regular but on the heavy side.  Needs contraception.  Chronic hypertension, hypothyroidism treated and hyperlipidemia on low-cholesterol diet.  Decision to start on progestin only birth control pill.  Usage, risks and benefits reviewed.  Progestin only pill prescribed.  Patient will call back after 3 months if no improvement in her menstrual flow to schedule a pelvic ultrasound.  Peggy Montgomery, it was a real pleasure to see you again today, after all those years!  I will inform you of your results as soon as they are available.  Health Maintenance, Female Adopting a healthy lifestyle and getting preventive care can go a long way to promote health and wellness. Talk with your health care provider about what schedule of regular examinations is right for you. This is a good chance for you to check in with your provider about disease prevention and staying healthy. In between checkups, there are plenty of things you can do on your own. Experts have done a lot of research about which lifestyle changes and preventive measures are most likely to keep you healthy. Ask your health care provider for more information. Weight and diet Eat a healthy diet  Be sure to include plenty of vegetables, fruits, low-fat dairy products, and lean protein.  Do not eat a lot of foods high in solid fats, added sugars, or salt.  Get regular exercise. This is one of the most important things you can do for your health. ? Most adults should exercise for at  least 150 minutes each week. The exercise should increase your heart rate and make you sweat (moderate-intensity exercise). ? Most adults should also do strengthening exercises at least twice a week. This is in addition to the moderate-intensity exercise.  Maintain a healthy weight  Body mass index (BMI) is a measurement that can be used to identify possible weight problems. It estimates body fat based on height and weight. Your health care provider can help determine your BMI and help you achieve or maintain a healthy weight.  For females 34 years of age and older: ? A BMI below 18.5 is considered underweight. ? A BMI of 18.5 to 24.9 is normal. ? A BMI of 25 to 29.9 is considered overweight. ? A BMI of 30 and above is considered obese.  Watch levels of cholesterol and blood lipids  You should start having your blood tested for lipids and cholesterol at 49 years of age, then have this test every 5 years.  You may need to have your cholesterol levels checked more often if: ? Your lipid or cholesterol levels are high. ? You are older than 49 years of age. ? You are at high risk for heart disease.  Cancer screening Lung Cancer  Lung cancer screening is recommended for adults 74-38 years old who are at high risk for lung cancer because of a history of smoking.  A yearly low-dose CT scan of the lungs is recommended for people who: ? Currently smoke. ? Have quit within the past 15 years. ? Have at least a 30-pack-year history of  smoking. A pack year is smoking an average of one pack of cigarettes a day for 1 year.  Yearly screening should continue until it has been 15 years since you quit.  Yearly screening should stop if you develop a health problem that would prevent you from having lung cancer treatment.  Breast Cancer  Practice breast self-awareness. This means understanding how your breasts normally appear and feel.  It also means doing regular breast self-exams. Let your  health care provider know about any changes, no matter how small.  If you are in your 20s or 30s, you should have a clinical breast exam (CBE) by a health care provider every 1-3 years as part of a regular health exam.  If you are 27 or older, have a CBE every year. Also consider having a breast X-ray (mammogram) every year.  If you have a family history of breast cancer, talk to your health care provider about genetic screening.  If you are at high risk for breast cancer, talk to your health care provider about having an MRI and a mammogram every year.  Breast cancer gene (BRCA) assessment is recommended for women who have family members with BRCA-related cancers. BRCA-related cancers include: ? Breast. ? Ovarian. ? Tubal. ? Peritoneal cancers.  Results of the assessment will determine the need for genetic counseling and BRCA1 and BRCA2 testing.  Cervical Cancer Your health care provider may recommend that you be screened regularly for cancer of the pelvic organs (ovaries, uterus, and vagina). This screening involves a pelvic examination, including checking for microscopic changes to the surface of your cervix (Pap test). You may be encouraged to have this screening done every 3 years, beginning at age 75.  For women ages 74-65, health care providers may recommend pelvic exams and Pap testing every 3 years, or they may recommend the Pap and pelvic exam, combined with testing for human papilloma virus (HPV), every 5 years. Some types of HPV increase your risk of cervical cancer. Testing for HPV may also be done on women of any age with unclear Pap test results.  Other health care providers may not recommend any screening for nonpregnant women who are considered low risk for pelvic cancer and who do not have symptoms. Ask your health care provider if a screening pelvic exam is right for you.  If you have had past treatment for cervical cancer or a condition that could lead to cancer, you need  Pap tests and screening for cancer for at least 20 years after your treatment. If Pap tests have been discontinued, your risk factors (such as having a new sexual partner) need to be reassessed to determine if screening should resume. Some women have medical problems that increase the chance of getting cervical cancer. In these cases, your health care provider may recommend more frequent screening and Pap tests.  Colorectal Cancer  This type of cancer can be detected and often prevented.  Routine colorectal cancer screening usually begins at 49 years of age and continues through 49 years of age.  Your health care provider may recommend screening at an earlier age if you have risk factors for colon cancer.  Your health care provider may also recommend using home test kits to check for hidden blood in the stool.  A small camera at the end of a tube can be used to examine your colon directly (sigmoidoscopy or colonoscopy). This is done to check for the earliest forms of colorectal cancer.  Routine screening usually begins at  age 76.  Direct examination of the colon should be repeated every 5-10 years through 49 years of age. However, you may need to be screened more often if early forms of precancerous polyps or small growths are found.  Skin Cancer  Check your skin from head to toe regularly.  Tell your health care provider about any new moles or changes in moles, especially if there is a change in a mole's shape or color.  Also tell your health care provider if you have a mole that is larger than the size of a pencil eraser.  Always use sunscreen. Apply sunscreen liberally and repeatedly throughout the day.  Protect yourself by wearing long sleeves, pants, a wide-brimmed hat, and sunglasses whenever you are outside.  Heart disease, diabetes, and high blood pressure  High blood pressure causes heart disease and increases the risk of stroke. High blood pressure is more likely to develop  in: ? People who have blood pressure in the high end of the normal range (130-139/85-89 mm Hg). ? People who are overweight or obese. ? People who are African American.  If you are 69-50 years of age, have your blood pressure checked every 3-5 years. If you are 59 years of age or older, have your blood pressure checked every year. You should have your blood pressure measured twice-once when you are at a hospital or clinic, and once when you are not at a hospital or clinic. Record the average of the two measurements. To check your blood pressure when you are not at a hospital or clinic, you can use: ? An automated blood pressure machine at a pharmacy. ? A home blood pressure monitor.  If you are between 32 years and 55 years old, ask your health care provider if you should take aspirin to prevent strokes.  Have regular diabetes screenings. This involves taking a blood sample to check your fasting blood sugar level. ? If you are at a normal weight and have a low risk for diabetes, have this test once every three years after 49 years of age. ? If you are overweight and have a high risk for diabetes, consider being tested at a younger age or more often. Preventing infection Hepatitis B  If you have a higher risk for hepatitis B, you should be screened for this virus. You are considered at high risk for hepatitis B if: ? You were born in a country where hepatitis B is common. Ask your health care provider which countries are considered high risk. ? Your parents were born in a high-risk country, and you have not been immunized against hepatitis B (hepatitis B vaccine). ? You have HIV or AIDS. ? You use needles to inject street drugs. ? You live with someone who has hepatitis B. ? You have had sex with someone who has hepatitis B. ? You get hemodialysis treatment. ? You take certain medicines for conditions, including cancer, organ transplantation, and autoimmune conditions.  Hepatitis C  Blood  testing is recommended for: ? Everyone born from [birth year] through 1967. ? Anyone with known risk factors for hepatitis C.  Sexually transmitted infections (STIs)  You should be screened for sexually transmitted infections (STIs) including gonorrhea and chlamydia if: ? You are sexually active and are younger than 49 years of age. ? You are older than 49 years of age and your health care provider tells you that you are at risk for this type of infection. ? Your sexual activity has changed since you were  last screened and you are at an increased risk for chlamydia or gonorrhea. Ask your health care provider if you are at risk.  If you do not have HIV, but are at risk, it may be recommended that you take a prescription medicine daily to prevent HIV infection. This is called pre-exposure prophylaxis (PrEP). You are considered at risk if: ? You are sexually active and do not regularly use condoms or know the HIV status of your partner(s). ? You take drugs by injection. ? You are sexually active with a partner who has HIV.  Talk with your health care provider about whether you are at high risk of being infected with HIV. If you choose to begin PrEP, you should first be tested for HIV. You should then be tested every 3 months for as long as you are taking PrEP. Pregnancy  If you are premenopausal and you may become pregnant, ask your health care provider about preconception counseling.  If you may become pregnant, take 400 to 800 micrograms (mcg) of folic acid every day.  If you want to prevent pregnancy, talk to your health care provider about birth control (contraception). Osteoporosis and menopause  Osteoporosis is a disease in which the bones lose minerals and strength with aging. This can result in serious bone fractures. Your risk for osteoporosis can be identified using a bone density scan.  If you are 24 years of age or older, or if you are at risk for osteoporosis and fractures, ask your  health care provider if you should be screened.  Ask your health care provider whether you should take a calcium or vitamin D supplement to lower your risk for osteoporosis.  Menopause may have certain physical symptoms and risks.  Hormone replacement therapy may reduce some of these symptoms and risks. Talk to your health care provider about whether hormone replacement therapy is right for you. Follow these instructions at home:  Schedule regular health, dental, and eye exams.  Stay current with your immunizations.  Do not use any tobacco products including cigarettes, chewing tobacco, or electronic cigarettes.  If you are pregnant, do not drink alcohol.  If you are breastfeeding, limit how much and how often you drink alcohol.  Limit alcohol intake to no more than 1 drink per day for nonpregnant women. One drink equals 12 ounces of beer, 5 ounces of wine, or 1 ounces of hard liquor.  Do not use street drugs.  Do not share needles.  Ask your health care provider for help if you need support or information about quitting drugs.  Tell your health care provider if you often feel depressed.  Tell your health care provider if you have ever been abused or do not feel safe at home. This information is not intended to replace advice given to you by your health care provider. Make sure you discuss any questions you have with your health care provider. Document Released: 05/13/2011 Document Revised: 04/04/2016 Document Reviewed: 08/01/2015 Elsevier Interactive Patient Education  [employer].

## 2017-12-04 NOTE — Progress Notes (Signed)
Peggy Montgomery [birth year] [id number]   History:    47 y.o. G1P1L1 Married.  Daughter 30 yo, doing very well (I did a stat C/S at 28 wks for HELLP)    RP:  New patient presenting for annual gyn exam   HPI:  Menses every month, heavy flow up to 7 days.  But Hb 14.3, TSH normal, on 10/15/2017.  Needs contraception.  No pelvic pain.  Normal vaginal secretions.  No problem with IC.  Breasts wnl.  Screening Mammo at work 10/2017, patient didn't get any result after.  Urine/BMs wnl.  Health Labs wnl with Family MD 10/2017.  Past medical history,surgical history, family history and social history were all reviewed and documented in the EPIC chart.  Gynecologic History Patient's last menstrual period was 11/19/2017. Contraception: condoms Last Pap: 14 years ago. Results were: normal Last mammogram: 10/2017. Results were: Will request report  Obstetric History OB History  Gravida Para Term Preterm AB Living  1 1       1   SAB TAB Ectopic Multiple Live Births               # Outcome Date GA Lbr Len/2nd Weight Sex Delivery Anes PTL Lv  1 Para                ROS: A ROS was performed and pertinent positives and negatives are included in the history.  GENERAL: No fevers or chills. HEENT: No change in vision, no earache, sore throat or sinus congestion. NECK: No pain or stiffness. CARDIOVASCULAR: No chest pain or pressure. No palpitations. PULMONARY: No shortness of breath, cough or wheeze. GASTROINTESTINAL: No abdominal pain, nausea, vomiting or diarrhea, melena or bright red blood per rectum. GENITOURINARY: No urinary frequency, urgency, hesitancy or dysuria. MUSCULOSKELETAL: No joint or muscle pain, no back pain, no recent trauma. DERMATOLOGIC: No rash, no itching, no lesions. ENDOCRINE: No polyuria, polydipsia, no heat or cold intolerance. No recent change in weight. HEMATOLOGICAL: No anemia or easy bruising or bleeding. NEUROLOGIC: No headache, seizures, numbness, tingling or weakness.  PSYCHIATRIC: No depression, no loss of interest in normal activity or change in sleep pattern.     Exam:   BP (!) 160/86   Ht 5\' 8"  (1.727 m)   Wt 179 lb (81.2 kg)   LMP 11/19/2017 Comment: NO BIRTH CONTROL   BMI 27.22 kg/m   Body mass index is 27.22 kg/m.  General appearance : Well developed well nourished female. No acute distress HEENT: Eyes: no retinal hemorrhage or exudates,  Neck supple, trachea midline, no carotid bruits, no thyroidmegaly Lungs: Clear to auscultation, no rhonchi or wheezes, or rib retractions  Heart: Regular rate and rhythm, no murmurs or gallops Breast:Examined in sitting and supine position were symmetrical in appearance, no palpable masses or tenderness,  no skin retraction, no nipple inversion, no nipple discharge, no skin discoloration, no axillary or supraclavicular lymphadenopathy Abdomen: no palpable masses or tenderness, no rebound or guarding Extremities: no edema or skin discoloration or tenderness  Pelvic: Vulva normal  Bartholin, Urethra, Skene Glands: Within normal limits             Vagina: No gross lesions or discharge  Cervix: No gross lesions or discharge.  Pap/HR HPV done  Uterus  RV, normal size, shape and consistency, non-tender and mobile  Adnexa  Without masses or tenderness  Anus and perineum  normal    Assessment/Plan:  47 y.o. female for annual exam   1. Encounter for routine gynecological examination  with Papanicolaou smear of cervix Normal gynecologic exam.  Pap test with high risk HPV done.  Breast exam normal.  Will obtain report of screening mammogram done December 2018.  Health labs done with family physician December 2018.  Patient is doing regular physical activity and is mindful of eating a lower carb nutrition.  Body mass index 27.22.  2. Encounter for initial prescription of contraceptive pills Menstrual periods are regular but on the heavy side.  Needs contraception.  Chronic hypertension, hypothyroidism treated and  hyperlipidemia on low-cholesterol diet.  Decision to start on progestin only birth control pill.  Usage, risks and benefits reviewed.  Progestin only pill prescribed.  Patient will call back after 3 months if no improvement in her menstrual flow to schedule a pelvic ultrasound.  Genia DelMarie-Lyne Dalan Cowger MD, 9:47 AM 12/04/2017

## 2017-12-08 LAB — PAP, TP IMAGING W/ HPV RNA, RFLX HPV TYPE 16,18/45: HPV DNA HIGH RISK: NOT DETECTED

## 2018-03-04 ENCOUNTER — Ambulatory Visit: Payer: BLUE CROSS/BLUE SHIELD | Admitting: Obstetrics & Gynecology

## 2018-03-04 ENCOUNTER — Encounter: Payer: Self-pay | Admitting: Obstetrics & Gynecology

## 2018-03-04 VITALS — BP 132/88

## 2018-03-04 DIAGNOSIS — N920 Excessive and frequent menstruation with regular cycle: Secondary | ICD-10-CM | POA: Diagnosis not present

## 2018-03-04 NOTE — Progress Notes (Signed)
    Peggy Montgomery 11/03/1971 [id number]        47 y.o.  G1P1L1 Married  RP: 3 month f/u on Progestin-only BCPs  HPI: Had heavy menses and needed contraception.  Started on the Progestin-only Pill 11/2017.  Good compliance.  Doing very well on it with no side effect and light periods.  No hot flashes or night sweats.  No pelvic pain.   OB History  Gravida Para Term Preterm AB Living  1 1       1   SAB TAB Ectopic Multiple Live Births               # Outcome Date GA Lbr Len/2nd Weight Sex Delivery Anes PTL Lv  1 Para             Past medical history,surgical history, problem list, medications, allergies, family history and social history were all reviewed and documented in the EPIC chart.   Directed ROS with pertinent positives and negatives documented in the history of present illness/assessment and plan.  Exam:  Vitals:   03/04/18 0842  BP: 132/88   General appearance:  Normal  Gyn exam differed   Assessment/Plan:  47 y.o. G1P1   1. Menorrhagia with regular cycle Heavy periods well controlled with the progestin only pill.  Good compliance and no side effects.  Blood pressure normal.  Using it as well for contraception.  Will follow-up for annual gynecologic exam January 2020.  Counseling on above issues and coordination of care more than 50% for 15 minutes.  Genia DelMarie-Lyne Willliam Pettet MD, 8:54 AM 03/04/2018

## 2018-03-04 NOTE — Patient Instructions (Signed)
1. Menorrhagia with regular cycle Heavy periods well controlled with the progestin only pill.  Good compliance and no side effects.  Blood pressure normal.  Using it as well for contraception.  Will follow-up for annual gynecologic exam January 2020.  [place], good seeing you today!

## 2018-11-20 DIAGNOSIS — Z1231 Encounter for screening mammogram for malignant neoplasm of breast: Secondary | ICD-10-CM | POA: Diagnosis not present

## 2018-11-25 ENCOUNTER — Other Ambulatory Visit: Payer: Self-pay | Admitting: Family Medicine

## 2018-11-25 DIAGNOSIS — N6489 Other specified disorders of breast: Secondary | ICD-10-CM

## 2018-12-07 ENCOUNTER — Encounter: Payer: BLUE CROSS/BLUE SHIELD | Admitting: Obstetrics & Gynecology

## 2019-01-21 ENCOUNTER — Other Ambulatory Visit: Payer: Self-pay | Admitting: Obstetrics & Gynecology

## 2019-01-22 NOTE — Telephone Encounter (Signed)
Annual scheduled on 02/08/19

## 2019-02-04 ENCOUNTER — Other Ambulatory Visit: Payer: Self-pay

## 2019-02-08 ENCOUNTER — Other Ambulatory Visit: Payer: Self-pay

## 2019-02-08 ENCOUNTER — Ambulatory Visit (INDEPENDENT_AMBULATORY_CARE_PROVIDER_SITE_OTHER): Payer: BLUE CROSS/BLUE SHIELD | Admitting: Obstetrics & Gynecology

## 2019-02-08 ENCOUNTER — Encounter: Payer: Self-pay | Admitting: Obstetrics & Gynecology

## 2019-02-08 VITALS — BP 114/70 | Ht 68.25 in | Wt 187.0 lb

## 2019-02-08 DIAGNOSIS — E663 Overweight: Secondary | ICD-10-CM

## 2019-02-08 DIAGNOSIS — R928 Other abnormal and inconclusive findings on diagnostic imaging of breast: Secondary | ICD-10-CM

## 2019-02-08 DIAGNOSIS — Z01419 Encounter for gynecological examination (general) (routine) without abnormal findings: Secondary | ICD-10-CM | POA: Diagnosis not present

## 2019-02-08 DIAGNOSIS — Z9189 Other specified personal risk factors, not elsewhere classified: Secondary | ICD-10-CM | POA: Diagnosis not present

## 2019-02-08 DIAGNOSIS — N951 Menopausal and female climacteric states: Secondary | ICD-10-CM | POA: Diagnosis not present

## 2019-02-08 MED ORDER — NORETHINDRONE 0.35 MG PO TABS: 1.0000 | ORAL_TABLET | Freq: Every day | ORAL | 4 refills | Status: DC

## 2019-02-08 NOTE — Patient Instructions (Signed)
1. Well female exam with routine gynecological exam Normal gynecologic exam.  Pap test January 2019 was negative with negative high-risk HPV, no indication to repeat this year.  Breast exam normal, but screening mammogram January 2020 at [facility] showed asymmetry in the left breast.  Will schedule a left breast diagnostic mammogram and ultrasound.  Will schedule a screening colonoscopy through her family physician.  Health labs with family physician.  2. Relies on partner vasectomy for contraception  3. Perimenopausal Doing well on the progestin only birth control pill to control cycles.  No contraindication to continue.  The progestin only birth control pill represcribed.  4. Abnormal mammogram of left breast Asymmetry in the left breast on screening mammogram at [facility] January 2020.  Will schedule a diagnostic left mammogram and ultrasound.    5. Overweight (BMI 25.0-29.9) Body mass index at 28.23.  Recommend slightly lower calorie/carb diet such as [employer].  Increase physical activity with aerobic physical activities 5 times a week and weightlifting every 2 days.  Other orders - norethindrone (MICRONOR,CAMILA,ERRIN) 0.35 MG tablet; Take 1 tablet (0.35 mg total) by mouth daily.  Peggy Montgomery, it was a pleasure seeing you today!

## 2019-02-08 NOTE — Progress Notes (Signed)
Peggy Montgomery 1971/10/13 [id number]   History:    48 y.o. G1P1L1 Married.  Vasectomy.  Daughter is 35 yo.  RP:  Established patient presenting for annual gyn exam   HPI: Well on the Progestin only BCPs because of Oligomenorrhea in perimenopause.  Menstrual periods about every 2-3 months.  No pelvic pain.  No pain with IC.  Urine/BMs normal.  No pain with IC.  Vasectomy.  Breasts normal, but screening mammo 11/2018 Left breast asymmetry, needs Left Dx mammo/US.  BMI 28.23.  Needs to exercise more.  Health labs with Fam MD.  Past medical history,surgical history, family history and social history were all reviewed and documented in the EPIC chart.  Gynecologic History Patient's last menstrual period was 11/18/2018. Contraception: vasectomy Last Pap: 11/2017. Results were: Negative/HPV HR neg Last mammogram: 11/2018. Results were: Left breast asymmetry, needs Left Dx mammo/US Bone Density: Never Colonoscopy: Will schedule first screening Colono through Fam MD  Obstetric History OB History  Gravida Para Term Preterm AB Living  1 1       1   SAB TAB Ectopic Multiple Live Births               # Outcome Date GA Lbr Len/2nd Weight Sex Delivery Anes PTL Lv  1 Para              ROS: A ROS was performed and pertinent positives and negatives are included in the history.  GENERAL: No fevers or chills. HEENT: No change in vision, no earache, sore throat or sinus congestion. NECK: No pain or stiffness. CARDIOVASCULAR: No chest pain or pressure. No palpitations. PULMONARY: No shortness of breath, cough or wheeze. GASTROINTESTINAL: No abdominal pain, nausea, vomiting or diarrhea, melena or bright red blood per rectum. GENITOURINARY: No urinary frequency, urgency, hesitancy or dysuria. MUSCULOSKELETAL: No joint or muscle pain, no back pain, no recent trauma. DERMATOLOGIC: No rash, no itching, no lesions. ENDOCRINE: No polyuria, polydipsia, no heat or cold intolerance. No recent change in weight.  HEMATOLOGICAL: No anemia or easy bruising or bleeding. NEUROLOGIC: No headache, seizures, numbness, tingling or weakness. PSYCHIATRIC: No depression, no loss of interest in normal activity or change in sleep pattern.     Exam:   BP 114/70   Ht 5' 8.25" (1.734 m)   Wt 187 lb (84.8 kg)   LMP 11/18/2018 Comment: pill  BMI 28.23 kg/m   Body mass index is 28.23 kg/m.  General appearance : Well developed well nourished female. No acute distress HEENT: Eyes: no retinal hemorrhage or exudates,  Neck supple, trachea midline, no carotid bruits, no thyroidmegaly Lungs: Clear to auscultation, no rhonchi or wheezes, or rib retractions  Heart: Regular rate and rhythm, no murmurs or gallops Breast:Examined in sitting and supine position were symmetrical in appearance, no palpable masses or tenderness,  no skin retraction, no nipple inversion, no nipple discharge, no skin discoloration, no axillary or supraclavicular lymphadenopathy Abdomen: no palpable masses or tenderness, no rebound or guarding Extremities: no edema or skin discoloration or tenderness  Pelvic: Vulva: Normal             Vagina: No gross lesions or discharge  Cervix: No gross lesions or discharge  Uterus  AV, normal size, shape and consistency, non-tender and mobile  Adnexa  Without masses or tenderness  Anus: Normal   Assessment/Plan:  48 y.o. female for annual exam   1. Well female exam with routine gynecological exam Normal gynecologic exam.  Pap test January 2019 was negative with  negative high-risk HPV, no indication to repeat this year.  Breast exam normal, but screening mammogram January 2020 at [facility] showed asymmetry in the left breast.  Will schedule a left breast diagnostic mammogram and ultrasound.  Will schedule a screening colonoscopy through her family physician.  Health labs with family physician.  2. Relies on partner vasectomy for contraception  3. Perimenopausal Doing well on the progestin only birth  control pill to control cycles.  No contraindication to continue.  The progestin only birth control pill represcribed.  4. Abnormal mammogram of left breast Asymmetry in the left breast on screening mammogram at [facility] January 2020.  Will schedule a diagnostic left mammogram and ultrasound.    5. Overweight (BMI 25.0-29.9) Body mass index at 28.23.  Recommend slightly lower calorie/carb diet such as [employer].  Increase physical activity with aerobic physical activities 5 times a week and weightlifting every 2 days.  Other orders - norethindrone (MICRONOR,CAMILA,ERRIN) 0.35 MG tablet; Take 1 tablet (0.35 mg total) by mouth daily.  Genia Del MD, 8:33 AM 02/08/2019

## 2019-02-09 ENCOUNTER — Telehealth: Payer: Self-pay | Admitting: *Deleted

## 2019-02-09 DIAGNOSIS — N6489 Other specified disorders of breast: Secondary | ICD-10-CM

## 2019-02-09 NOTE — Telephone Encounter (Signed)
-----   Message from Genia Del, MD sent at 02/08/2019  8:43 AM EDT ----- Regarding: Refer for Left Dx mammo/US Screening mammo [facility] 11/2018:  Asymmetry Left breast.  Needs Left Dx Mammo/US.

## 2019-02-09 NOTE — Telephone Encounter (Signed)
Patient scheduled on 02/12/19 @ 1:00pm at breast center, patient informed.

## 2019-02-12 ENCOUNTER — Other Ambulatory Visit: Payer: Self-pay

## 2019-02-25 ENCOUNTER — Ambulatory Visit
Admission: RE | Admit: 2019-02-25 | Discharge: 2019-02-25 | Disposition: A | Discharge status: Home / Self Care | Payer: BLUE CROSS/BLUE SHIELD | Source: Ambulatory Visit | Attending: Obstetrics & Gynecology | Admitting: Obstetrics & Gynecology

## 2019-02-25 ENCOUNTER — Other Ambulatory Visit: Payer: Self-pay

## 2019-02-25 DIAGNOSIS — N6489 Other specified disorders of breast: Secondary | ICD-10-CM

## 2019-02-25 DIAGNOSIS — R928 Other abnormal and inconclusive findings on diagnostic imaging of breast: Secondary | ICD-10-CM | POA: Diagnosis not present

## 2019-12-24 DIAGNOSIS — Z20828 Contact with and (suspected) exposure to other viral communicable diseases: Secondary | ICD-10-CM | POA: Diagnosis not present

## 2020-03-19 ENCOUNTER — Other Ambulatory Visit: Payer: Self-pay | Admitting: Obstetrics & Gynecology

## 2020-03-21 ENCOUNTER — Telehealth: Payer: Self-pay | Admitting: *Deleted

## 2020-03-21 MED ORDER — NORETHINDRONE 0.35 MG PO TABS: 1.0000 | ORAL_TABLET | Freq: Every day | ORAL | 0 refills | Status: DC

## 2020-03-21 NOTE — Telephone Encounter (Signed)
Patient called requesting refill on Micronor 0.35 mg tablet, annual exam scheduled on 06/08/20. Rx sent.

## 2020-06-08 ENCOUNTER — Other Ambulatory Visit: Payer: Self-pay

## 2020-06-08 ENCOUNTER — Encounter: Payer: Self-pay | Admitting: Obstetrics & Gynecology

## 2020-06-08 ENCOUNTER — Encounter: Payer: Self-pay | Admitting: *Deleted

## 2020-06-08 ENCOUNTER — Ambulatory Visit (INDEPENDENT_AMBULATORY_CARE_PROVIDER_SITE_OTHER): Payer: BC Managed Care – PPO | Admitting: Obstetrics & Gynecology

## 2020-06-08 VITALS — BP 134/86 | Ht 68.0 in | Wt 185.6 lb

## 2020-06-08 DIAGNOSIS — Z01419 Encounter for gynecological examination (general) (routine) without abnormal findings: Secondary | ICD-10-CM

## 2020-06-08 DIAGNOSIS — N951 Menopausal and female climacteric states: Secondary | ICD-10-CM | POA: Diagnosis not present

## 2020-06-08 DIAGNOSIS — E663 Overweight: Secondary | ICD-10-CM

## 2020-06-08 MED ORDER — NORETHINDRONE 0.35 MG PO TABS: 1.0000 | ORAL_TABLET | Freq: Every day | ORAL | 4 refills | Status: DC

## 2020-06-08 NOTE — Progress Notes (Signed)
Peggy Montgomery 1970/12/16 [id number]   History:    49 y.o. G1P1L1 Married.  Vasectomy.  Daughter is 62 yo.  RP:  Established patient presenting for annual gyn exam   HPI: Well on the Progestin only BCPs because of Oligomenorrhea in perimenopause.  Menstrual periods about every 2-3 months.  No pelvic pain.  No pain with IC.  Urine/BMs normal.  No pain with IC.  Vasectomy.  Breasts normal, but screening mammo 11/2018 Left breast asymmetry, Left Dx mammo/US benign 02/2019.  BMI 28.22.  Needs to exercise more.  Health labs with Fam MD.  Past medical history,surgical history, family history and social history were all reviewed and documented in the EPIC chart.  Gynecologic History Patient's last menstrual period was 02/10/2020.  Obstetric History OB History  Gravida Para Term Preterm AB Living  1 1       1   SAB TAB Ectopic Multiple Live Births               # Outcome Date GA Lbr Len/2nd Weight Sex Delivery Anes PTL Lv  1 Para              ROS: A ROS was performed and pertinent positives and negatives are included in the history.  GENERAL: No fevers or chills. HEENT: No change in vision, no earache, sore throat or sinus congestion. NECK: No pain or stiffness. CARDIOVASCULAR: No chest pain or pressure. No palpitations. PULMONARY: No shortness of breath, cough or wheeze. GASTROINTESTINAL: No abdominal pain, nausea, vomiting or diarrhea, melena or bright red blood per rectum. GENITOURINARY: No urinary frequency, urgency, hesitancy or dysuria. MUSCULOSKELETAL: No joint or muscle pain, no back pain, no recent trauma. DERMATOLOGIC: No rash, no itching, no lesions. ENDOCRINE: No polyuria, polydipsia, no heat or cold intolerance. No recent change in weight. HEMATOLOGICAL: No anemia or easy bruising or bleeding. NEUROLOGIC: No headache, seizures, numbness, tingling or weakness. PSYCHIATRIC: No depression, no loss of interest in normal activity or change in sleep pattern.     Exam:   BP (!) 134/86    Ht 5\' 8"  (1.727 m)   Wt 185 lb 9.6 oz (84.2 kg)   LMP 02/10/2020 Comment: pill  BMI 28.22 kg/m   Body mass index is 28.22 kg/m.  General appearance : Well developed well nourished female. No acute distress HEENT: Eyes: no retinal hemorrhage or exudates,  Neck supple, trachea midline, no carotid bruits, no thyroidmegaly Lungs: Clear to auscultation, no rhonchi or wheezes, or rib retractions  Heart: Regular rate and rhythm, no murmurs or gallops Breast:Examined in sitting and supine position were symmetrical in appearance, no palpable masses or tenderness,  no skin retraction, no nipple inversion, no nipple discharge, no skin discoloration, no axillary or supraclavicular lymphadenopathy Abdomen: no palpable masses or tenderness, no rebound or guarding Extremities: no edema or skin discoloration or tenderness  Pelvic: Vulva: Normal             Vagina: No gross lesions or discharge  Cervix: No gross lesions or discharge.  Pap reflex done.  Uterus  AV, normal size, shape and consistency, non-tender and mobile  Adnexa  Without masses or tenderness  Anus: Normal   Assessment/Plan:  49 y.o. female for annual exam   1. Encounter for routine gynecological examination with Papanicolaou smear of cervix Normal gynecologic exam.  Pap reflex done.  Breast exam normal.  Schedule screening mammogram as soon as possible.  Refer to gastro for screening colonoscopy.  Health labs with family physician at work.  2. Perimenopausal Perimenopausal, well on the progestin only pill.  Light menstrual period every 2 to 3 months.  No contraindication to continue.  Prescription sent to pharmacy.  3. Overweight (BMI 25.0-29.9) Recommend a slightly lower calorie/carb diet.  Aerobic activities 5 times a week and light weightlifting every 2 days.  Other orders - norethindrone (MICRONOR) 0.35 MG tablet; Take 1 tablet (0.35 mg total) by mouth daily.  Genia Del MD, 8:41 AM 06/08/2020

## 2020-06-08 NOTE — Addendum Note (Signed)
Addended by: Berna Spare A on: 06/08/2020 02:50 PM   Modules accepted: Orders

## 2020-06-09 LAB — PAP IG W/ RFLX HPV ASCU

## 2020-06-12 ENCOUNTER — Encounter: Payer: Self-pay | Admitting: *Deleted

## 2020-10-18 DIAGNOSIS — Z20822 Contact with and (suspected) exposure to covid-19: Secondary | ICD-10-CM | POA: Diagnosis not present

## 2020-10-18 DIAGNOSIS — Z03818 Encounter for observation for suspected exposure to other biological agents ruled out: Secondary | ICD-10-CM | POA: Diagnosis not present

## 2020-10-18 IMAGING — MG DIGITAL DIAGNOSTIC UNILATERAL LEFT MAMMOGRAM WITH TOMO AND CAD
6 series · 6 of 18 positions shown · non-contrast
Comparison: Previous exam(s).

CLINICAL DATA: The patient was called back at an outside
institution due to a focal asymmetry in the upper outer left breast.

EXAM:
DIGITAL DIAGNOSTIC LEFT MAMMOGRAM WITH CAD AND TOMO
ULTRASOUND LEFT BREAST

[L MLO synth-2D (1 of 2)]
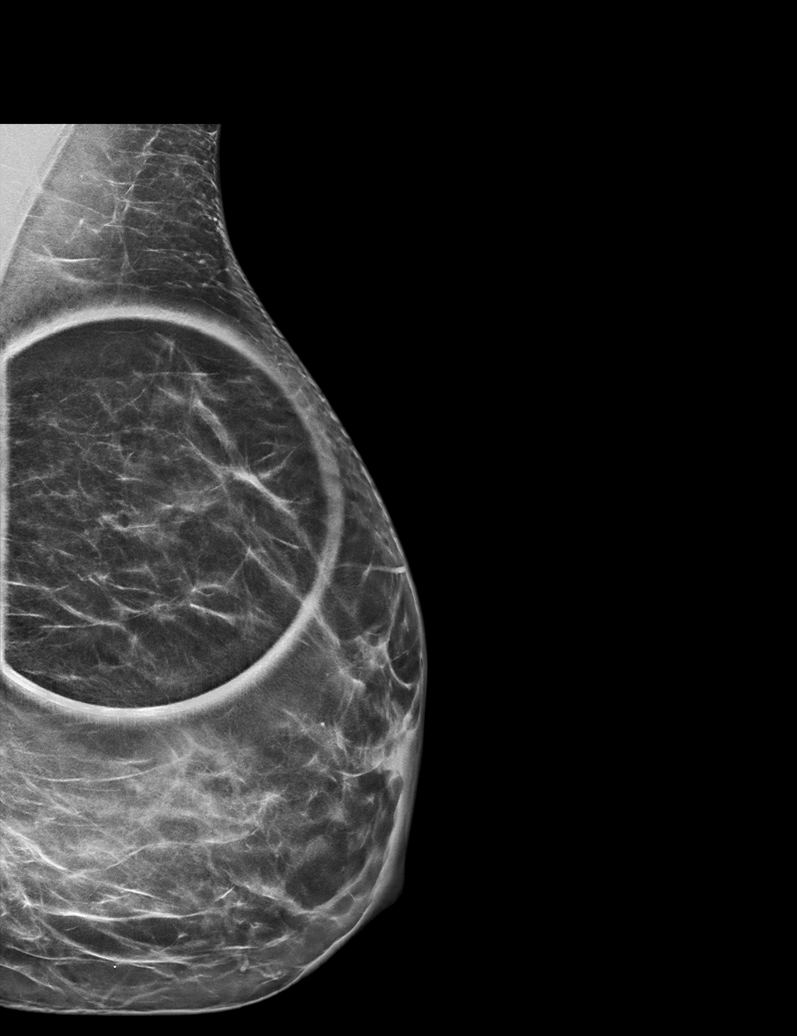

[L CC synth-2D]
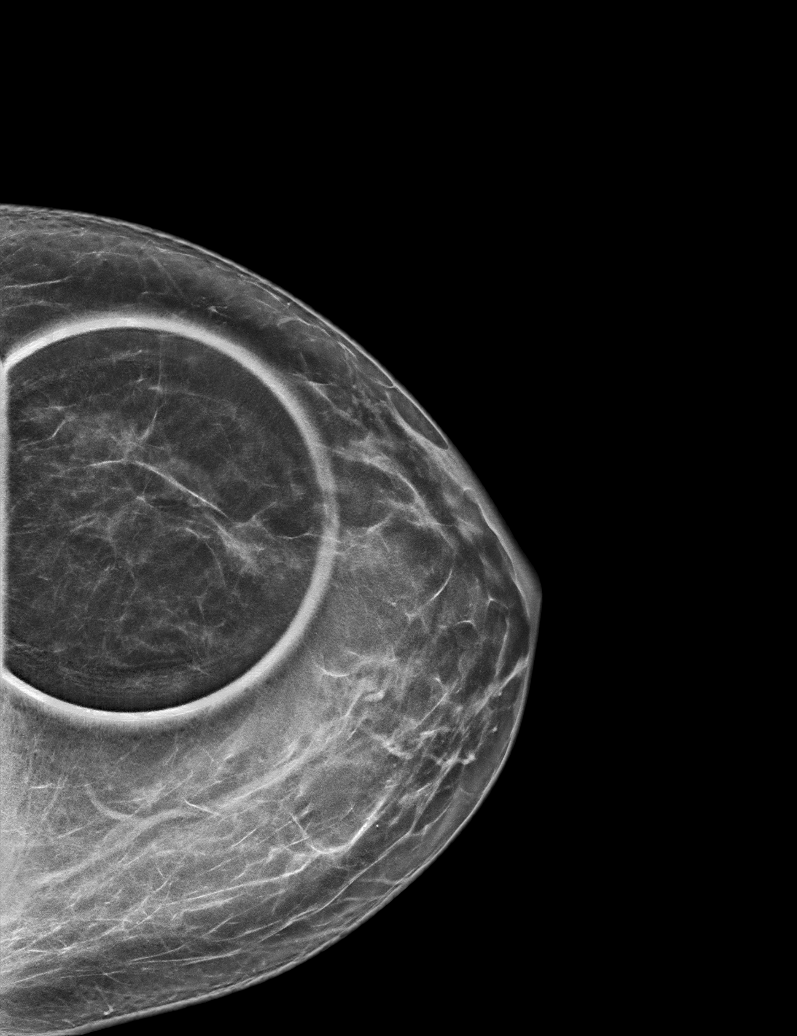

[L MLO synth-2D (2 of 2)]
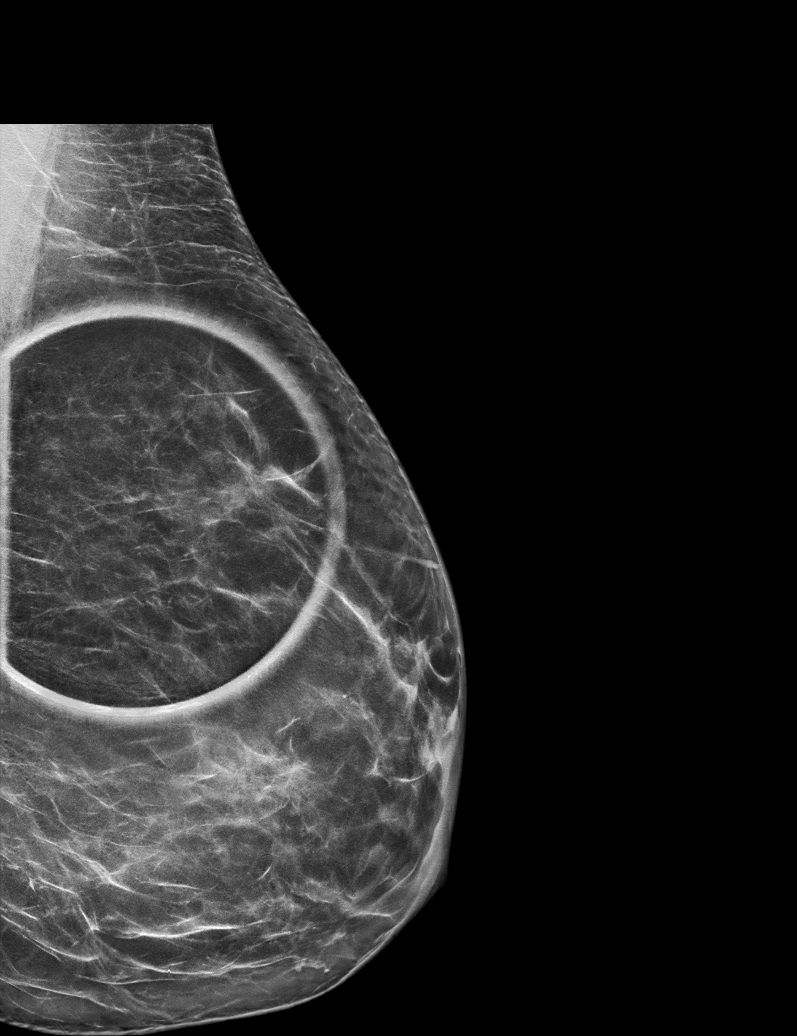

[L MLO tomo (1 of 2) · tomo slice 36/71.0]
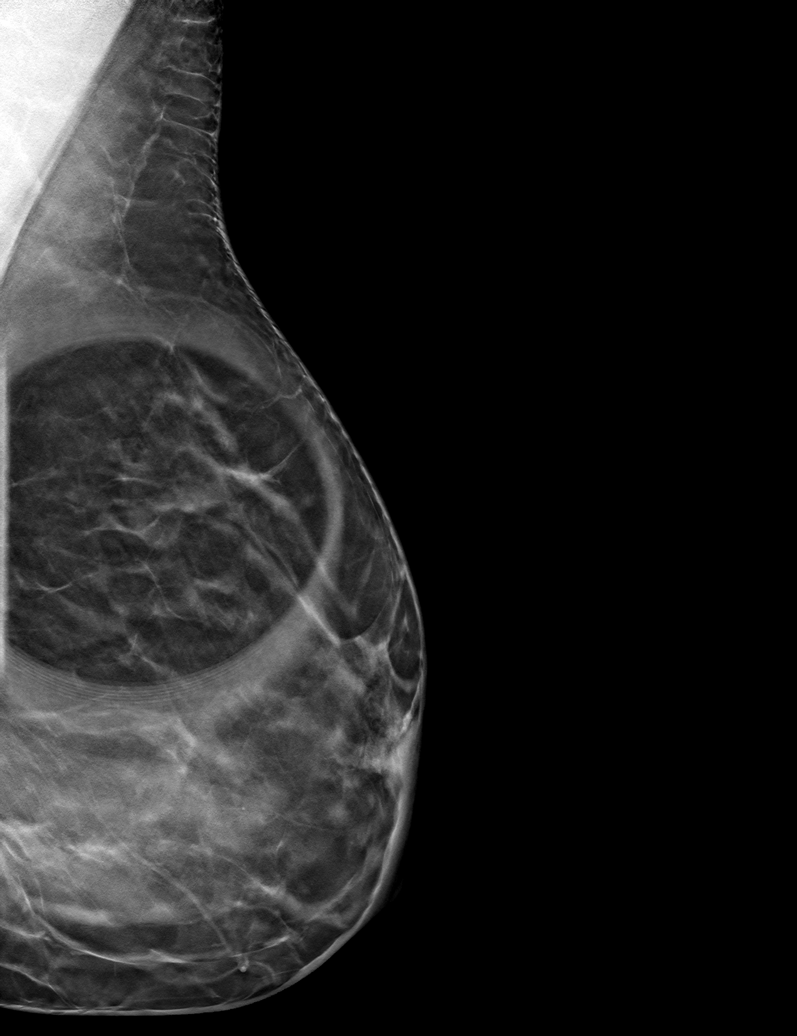

[L MLO tomo (2 of 2) · tomo slice 37/72.0]
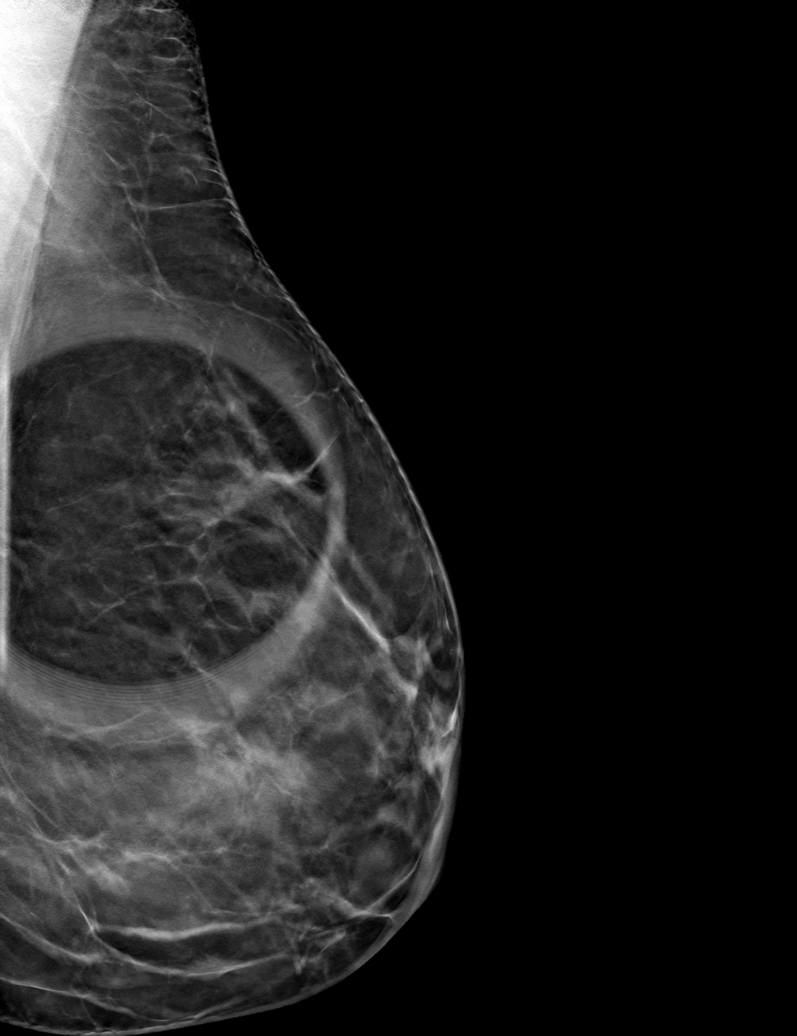

[L CC tomo · tomo slice 32/63.0]
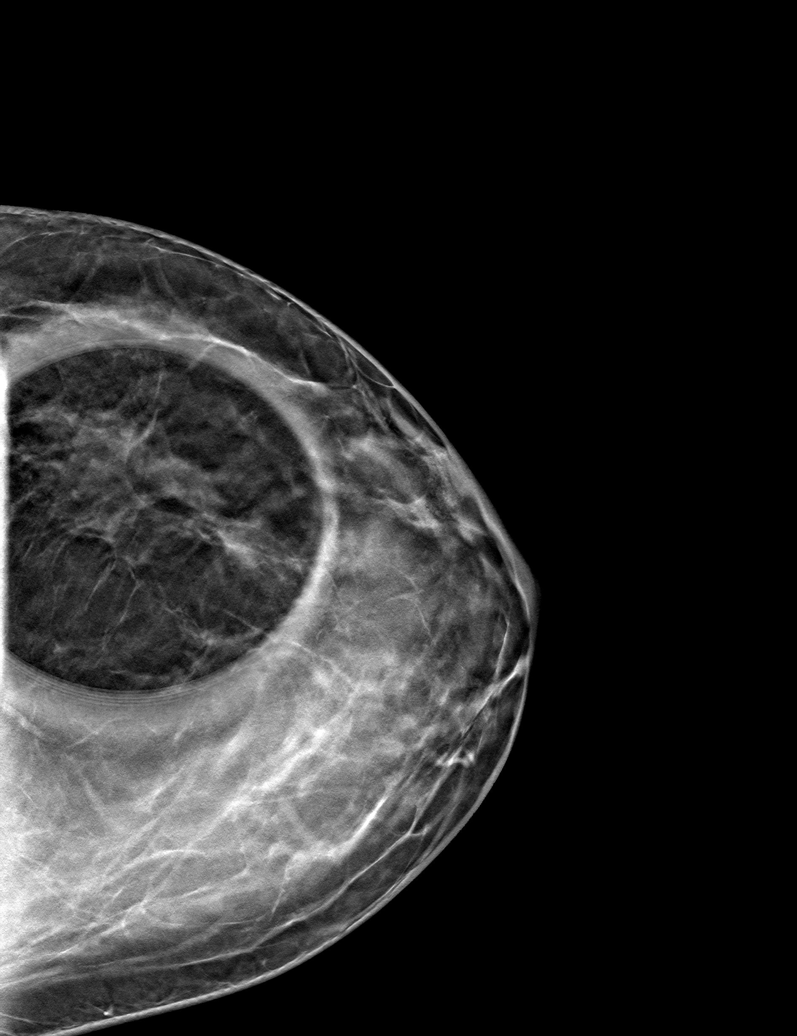

[6 of 18 positions shown; findings below may reference images not displayed]

ACR Breast Density Category b: There are scattered areas of
fibroglandular density.
FINDINGS: The focal asymmetry has the appearance of glandular tissue on
today's

On physical exam, no suspicious lumps are identified.

Targeted ultrasound is performed, showing no sonographic
abnormalities in the upper outer left breast.
IMPRESSION: The focal asymmetry is glandular tissue. After speaking to the
patient, it was discovered that she has lost significant weight
since her 7396 mammogram which explains the findings on the most
recent screening mammogram.

RECOMMENDATION:
Recommend annual screening mammography.

I have discussed the findings and recommendations with the patient.
Results were also provided in writing at the conclusion of the
visit. If applicable, a reminder letter will be sent to the patient
regarding the next appointment.

BI-RADS CATEGORY  2: Benign.

## 2020-10-18 IMAGING — US ULTRASOUND LEFT BREAST LIMITED
1 series · 4 of 4 positions shown · non-contrast
Comparison: Previous exam(s).

CLINICAL DATA: The patient was called back at an outside
institution due to a focal asymmetry in the upper outer left breast.

EXAM:
DIGITAL DIAGNOSTIC LEFT MAMMOGRAM WITH CAD AND TOMO
ULTRASOUND LEFT BREAST

[Series 1: ultrasound left breast limited · 0.08mm/px · 4 of 4 slices shown]
[im 1/4]
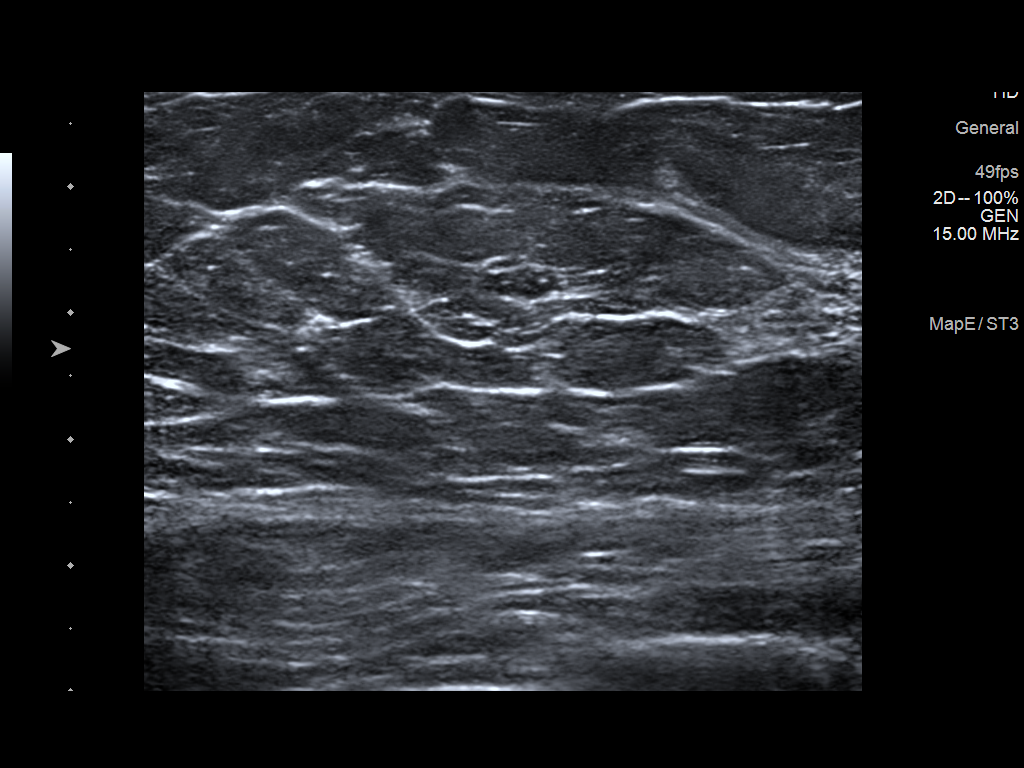
[im 2/4]
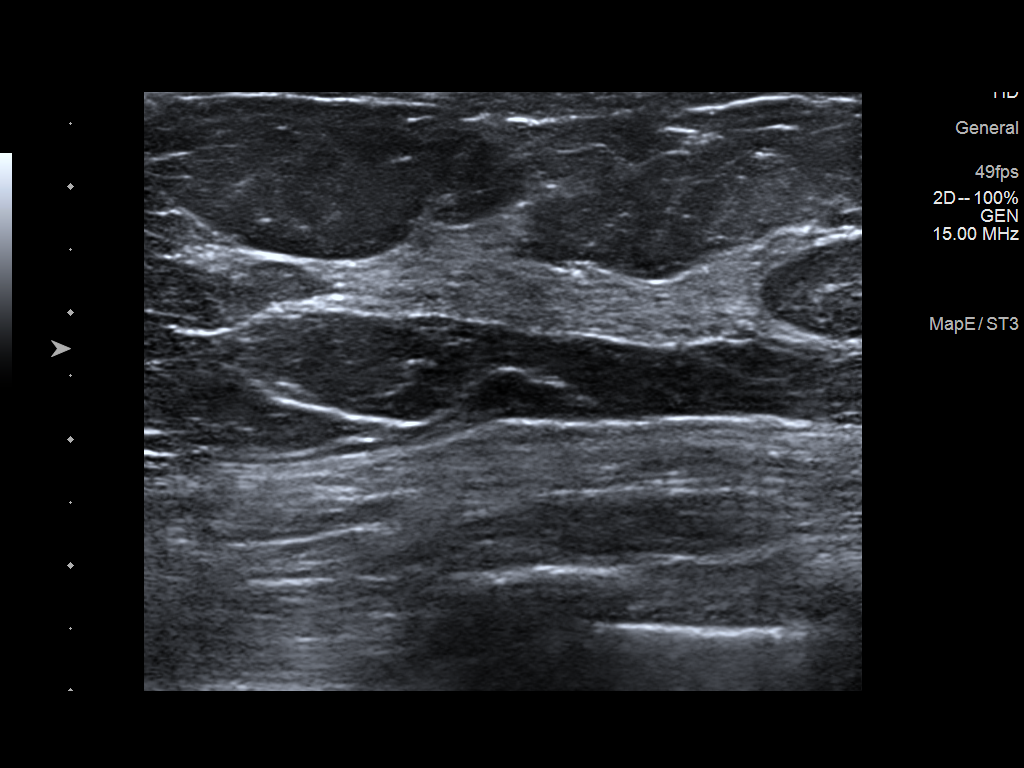
[im 3/4]
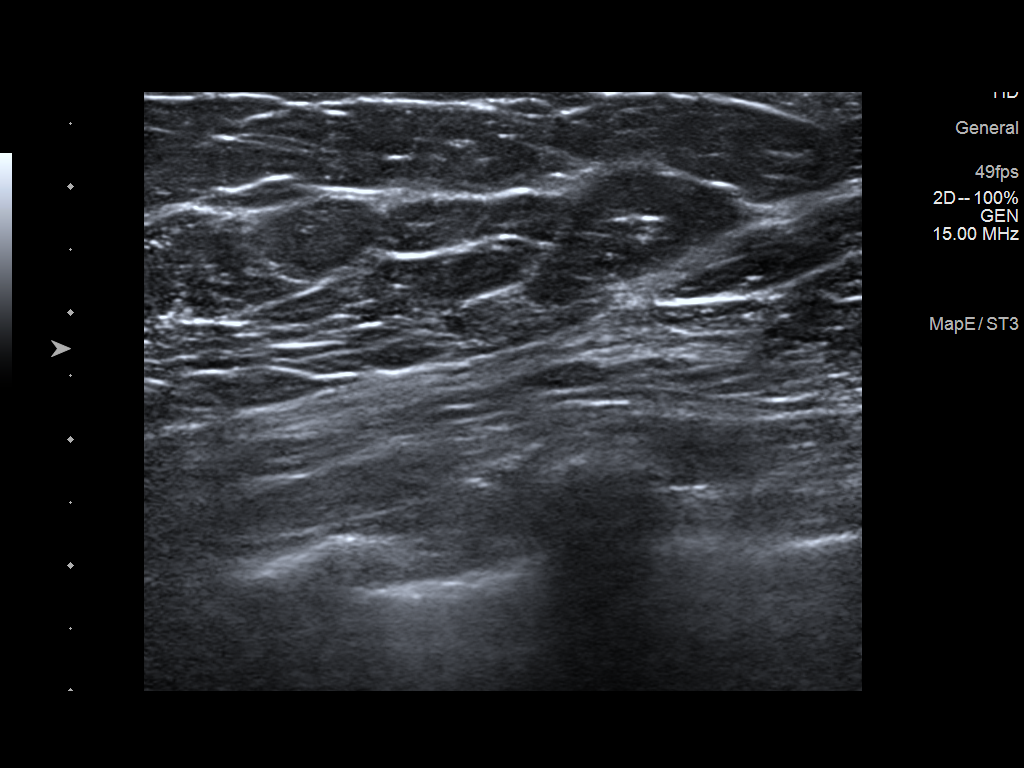
[im 4/4]
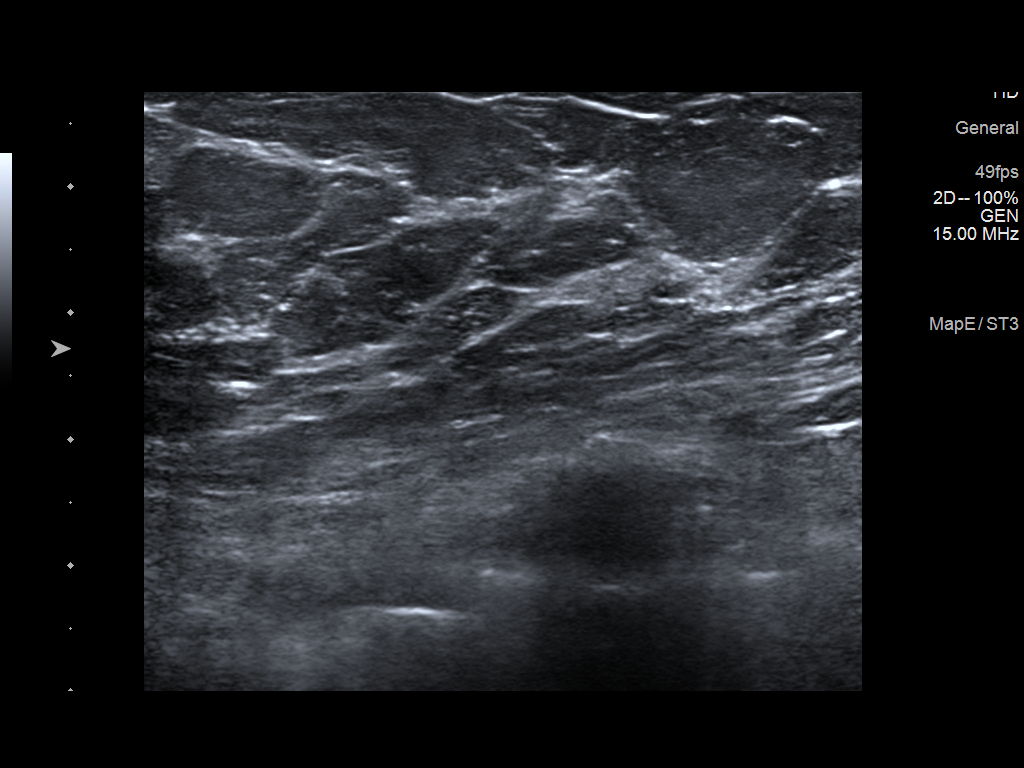

[4 of 4 positions shown; findings below may reference images not displayed]

ACR Breast Density Category b: There are scattered areas of
fibroglandular density.
FINDINGS: The focal asymmetry has the appearance of glandular tissue on
today's

On physical exam, no suspicious lumps are identified.

Targeted ultrasound is performed, showing no sonographic
abnormalities in the upper outer left breast.
IMPRESSION: The focal asymmetry is glandular tissue. After speaking to the
patient, it was discovered that she has lost significant weight
since her 7396 mammogram which explains the findings on the most
recent screening mammogram.

RECOMMENDATION:
Recommend annual screening mammography.

I have discussed the findings and recommendations with the patient.
Results were also provided in writing at the conclusion of the
visit. If applicable, a reminder letter will be sent to the patient
regarding the next appointment.

BI-RADS CATEGORY  2: Benign.

## 2020-10-23 DIAGNOSIS — Z20822 Contact with and (suspected) exposure to covid-19: Secondary | ICD-10-CM | POA: Diagnosis not present

## 2020-10-23 DIAGNOSIS — Z03818 Encounter for observation for suspected exposure to other biological agents ruled out: Secondary | ICD-10-CM | POA: Diagnosis not present

## 2020-12-06 DIAGNOSIS — Z20822 Contact with and (suspected) exposure to covid-19: Secondary | ICD-10-CM | POA: Diagnosis not present

## 2020-12-22 DIAGNOSIS — Z20822 Contact with and (suspected) exposure to covid-19: Secondary | ICD-10-CM | POA: Diagnosis not present

## 2021-06-11 ENCOUNTER — Encounter: Payer: Self-pay | Admitting: Obstetrics & Gynecology

## 2021-06-11 ENCOUNTER — Ambulatory Visit (INDEPENDENT_AMBULATORY_CARE_PROVIDER_SITE_OTHER): Payer: BC Managed Care – PPO | Admitting: Obstetrics & Gynecology

## 2021-06-11 ENCOUNTER — Other Ambulatory Visit: Payer: Self-pay

## 2021-06-11 VITALS — BP 108/72 | HR 61 | Resp 16 | Ht 68.25 in | Wt 167.0 lb

## 2021-06-11 DIAGNOSIS — N951 Menopausal and female climacteric states: Secondary | ICD-10-CM | POA: Diagnosis not present

## 2021-06-11 DIAGNOSIS — Z01419 Encounter for gynecological examination (general) (routine) without abnormal findings: Secondary | ICD-10-CM | POA: Diagnosis not present

## 2021-06-11 MED ORDER — NORETHINDRONE 0.35 MG PO TABS: 1.0000 | ORAL_TABLET | Freq: Every day | ORAL | 4 refills | Status: DC

## 2021-06-11 NOTE — Progress Notes (Signed)
Peggy Montgomery January 29, 1971 [id number]   History:    50 y.o. G1P1L1 Married.  Vasectomy.  Daughter is 7 yo.   RP:  Established patient presenting for annual gyn exam   HPI: Well on the Progestin only BCPs because of Oligomenorrhea in perimenopause.  Menstrual periods about every 2-3 months.  No pelvic pain.  No pain with IC.  Urine/BMs normal.  No pain with IC.  Vasectomy.  Breasts normal, but screening mammo 11/2018 Left breast asymmetry, Left Dx mammo/US benign 02/2019.  BMI 25.21.  Exercising more.  Health labs with Fam MD.    Past medical history,surgical history, family history and social history were all reviewed and documented in the EPIC chart.  Gynecologic History No LMP recorded. (Menstrual status: Oral contraceptives).  Obstetric History OB History  Gravida Para Term Preterm AB Living  1 1       1   SAB IAB Ectopic Multiple Live Births               # Outcome Date GA Lbr Len/2nd Weight Sex Delivery Anes PTL Lv  1 Para              ROS: A ROS was performed and pertinent positives and negatives are included in the history.  GENERAL: No fevers or chills. HEENT: No change in vision, no earache, sore throat or sinus congestion. NECK: No pain or stiffness. CARDIOVASCULAR: No chest pain or pressure. No palpitations. PULMONARY: No shortness of breath, cough or wheeze. GASTROINTESTINAL: No abdominal pain, nausea, vomiting or diarrhea, melena or bright red blood per rectum. GENITOURINARY: No urinary frequency, urgency, hesitancy or dysuria. MUSCULOSKELETAL: No joint or muscle pain, no back pain, no recent trauma. DERMATOLOGIC: No rash, no itching, no lesions. ENDOCRINE: No polyuria, polydipsia, no heat or cold intolerance. No recent change in weight. HEMATOLOGICAL: No anemia or easy bruising or bleeding. NEUROLOGIC: No headache, seizures, numbness, tingling or weakness. PSYCHIATRIC: No depression, no loss of interest in normal activity or change in sleep pattern.     Exam:   BP  108/72   Pulse 61   Resp 16   Ht 5' 8.25" (1.734 m)   Wt 167 lb (75.8 kg)   BMI 25.21 kg/m   Body mass index is 25.21 kg/m.  General appearance : Well developed well nourished female. No acute distress HEENT: Eyes: no retinal hemorrhage or exudates,  Neck supple, trachea midline, no carotid bruits, no thyroidmegaly Lungs: Clear to auscultation, no rhonchi or wheezes, or rib retractions  Heart: Regular rate and rhythm, no murmurs or gallops Breast:Examined in sitting and supine position were symmetrical in appearance, no palpable masses or tenderness,  no skin retraction, no nipple inversion, no nipple discharge, no skin discoloration, no axillary or supraclavicular lymphadenopathy Abdomen: no palpable masses or tenderness, no rebound or guarding Extremities: no edema or skin discoloration or tenderness  Pelvic: Vulva: Normal             Vagina: No gross lesions or discharge  Cervix: No gross lesions or discharge  Uterus  AV, normal size, shape and consistency, non-tender and mobile  Adnexa  Without masses or tenderness  Anus: Normal   Assessment/Plan:  50 y.o. female for annual exam   1. Well female exam with routine gynecological exam Normal gynecologic exam.  Pap test in 2021 was negative, will repeat at 3 years.  Breast exam normal.  Patient strongly advised to schedule a screening mammogram.  The last mammogram was in 2020.  Good body mass  index with weight loss since last year, body mass index is 25.21.  Continue with fitness and healthy nutrition.  Health labs with family physician.  2. Perimenopausal Well on the progestin only birth control pill.  No contraindication to continue.  Prescription sent to pharmacy.  Other orders - levothyroxine (SYNTHROID) 88 MCG tablet; Take 88 mcg by mouth every morning. - Vitamin D, Ergocalciferol, (DRISDOL) 1.25 MG (50000 UNIT) CAPS capsule; Take 50,000 Units by mouth once a week. - norethindrone (MICRONOR) 0.35 MG tablet; Take 1 tablet  (0.35 mg total) by mouth daily.   Genia Del MD, 8:45 AM 06/11/2021

## 2021-07-17 DIAGNOSIS — Z1231 Encounter for screening mammogram for malignant neoplasm of breast: Secondary | ICD-10-CM | POA: Diagnosis not present

## 2021-08-09 ENCOUNTER — Other Ambulatory Visit: Payer: Self-pay | Admitting: Obstetrics & Gynecology

## 2022-06-04 ENCOUNTER — Encounter: Payer: Self-pay | Admitting: Gastroenterology

## 2022-07-04 ENCOUNTER — Ambulatory Visit: Payer: BLUE CROSS/BLUE SHIELD | Admitting: Gastroenterology

## 2022-07-18 ENCOUNTER — Other Ambulatory Visit: Payer: Self-pay | Admitting: Obstetrics & Gynecology

## 2022-07-18 DIAGNOSIS — N951 Menopausal and female climacteric states: Secondary | ICD-10-CM

## 2022-07-18 DIAGNOSIS — Z1231 Encounter for screening mammogram for malignant neoplasm of breast: Secondary | ICD-10-CM | POA: Diagnosis not present

## 2022-07-19 NOTE — Telephone Encounter (Signed)
Last AEX 06/11/2021--scheduled for 09/04/2022. Last mammo 07/17/2021-neg birads 1

## 2022-09-04 ENCOUNTER — Ambulatory Visit: Payer: BC Managed Care – PPO | Admitting: Obstetrics & Gynecology

## 2022-09-05 ENCOUNTER — Encounter: Payer: Self-pay | Admitting: Obstetrics & Gynecology

## 2022-09-05 ENCOUNTER — Other Ambulatory Visit (HOSPITAL_COMMUNITY)
Admission: RE | Admit: 2022-09-05 | Discharge: 2022-09-05 | Disposition: A | Discharge status: Home / Self Care | Payer: BC Managed Care – PPO | Source: Ambulatory Visit | Attending: Obstetrics & Gynecology | Admitting: Obstetrics & Gynecology

## 2022-09-05 ENCOUNTER — Ambulatory Visit (INDEPENDENT_AMBULATORY_CARE_PROVIDER_SITE_OTHER): Payer: BC Managed Care – PPO | Admitting: Obstetrics & Gynecology

## 2022-09-05 VITALS — BP 120/84 | HR 72 | Ht 67.75 in | Wt 198.0 lb

## 2022-09-05 DIAGNOSIS — N951 Menopausal and female climacteric states: Secondary | ICD-10-CM

## 2022-09-05 DIAGNOSIS — Z113 Encounter for screening for infections with a predominantly sexual mode of transmission: Secondary | ICD-10-CM | POA: Diagnosis not present

## 2022-09-05 DIAGNOSIS — E6609 Other obesity due to excess calories: Secondary | ICD-10-CM

## 2022-09-05 DIAGNOSIS — Z9189 Other specified personal risk factors, not elsewhere classified: Secondary | ICD-10-CM

## 2022-09-05 DIAGNOSIS — Z01419 Encounter for gynecological examination (general) (routine) without abnormal findings: Secondary | ICD-10-CM

## 2022-09-05 MED ORDER — NORETHINDRONE 0.35 MG PO TABS: 1.0000 | ORAL_TABLET | Freq: Every day | ORAL | 4 refills | Status: DC

## 2022-09-05 NOTE — Progress Notes (Signed)
Peggy Montgomery Apr 30, 1971 [id number]   History:    51 y.o. G1P1L1 Married.  Vasectomy.  Daughter is 31 yo training her horse.   RP:  Established patient presenting for annual gyn exam   HPI: Well on the Progestin only BCPs because of Oligomenorrhea in perimenopause.  Menstrual periods about every 2-3 months.  No pelvic pain.  No pain with IC. Vasectomy.  Marital issues, now better.  Pap Neg 05/2020.  Pap reflex/Gono-Chlam today.  Breasts normal.  Mammo Neg 07/2022.  BMI increased to 30.33.  Will start exercising more.  Health labs with Fam MD.  Recommend Colono.    Past medical history,surgical history, family history and social history were all reviewed and documented in the EPIC chart.  Gynecologic History No LMP recorded.  Obstetric History OB History  Gravida Para Term Preterm AB Living  1 1 1     1   SAB IAB Ectopic Multiple Live Births               # Outcome Date GA Lbr Len/2nd Weight Sex Delivery Anes PTL Lv  1 Term              ROS: A ROS was performed and pertinent positives and negatives are included in the history. GENERAL: No fevers or chills. HEENT: No change in vision, no earache, sore throat or sinus congestion. NECK: No pain or stiffness. CARDIOVASCULAR: No chest pain or pressure. No palpitations. PULMONARY: No shortness of breath, cough or wheeze. GASTROINTESTINAL: No abdominal pain, nausea, vomiting or diarrhea, melena or bright red blood per rectum. GENITOURINARY: No urinary frequency, urgency, hesitancy or dysuria. MUSCULOSKELETAL: No joint or muscle pain, no back pain, no recent trauma. DERMATOLOGIC: No rash, no itching, no lesions. ENDOCRINE: No polyuria, polydipsia, no heat or cold intolerance. No recent change in weight. HEMATOLOGICAL: No anemia or easy bruising or bleeding. NEUROLOGIC: No headache, seizures, numbness, tingling or weakness. PSYCHIATRIC: No depression, no loss of interest in normal activity or change in sleep pattern.     Exam:   BP 120/84    Pulse 72   Ht 5' 7.75" (1.721 m)   Wt 198 lb (89.8 kg)   SpO2 98%   BMI 30.33 kg/m   Body mass index is 30.33 kg/m.  General appearance : Well developed well nourished female. No acute distress HEENT: Eyes: no retinal hemorrhage or exudates,  Neck supple, trachea midline, no carotid bruits, no thyroidmegaly Lungs: Clear to auscultation, no rhonchi or wheezes, or rib retractions  Heart: Regular rate and rhythm, no murmurs or gallops Breast:Examined in sitting and supine position were symmetrical in appearance, no palpable masses or tenderness,  no skin retraction, no nipple inversion, no nipple discharge, no skin discoloration, no axillary or supraclavicular lymphadenopathy Abdomen: no palpable masses or tenderness, no rebound or guarding Extremities: no edema or skin discoloration or tenderness  Pelvic: Vulva: Normal             Vagina: No gross lesions or discharge  Cervix: No gross lesions or discharge.  Pap reflex/Gono-Chlam done.  Uterus  AV, normal size, shape and consistency, non-tender and mobile  Adnexa  Without masses or tenderness  Anus: Normal   Assessment/Plan:  51 y.o. female for annual exam   1. Encounter for routine gynecological examination with Papanicolaou smear of cervix Well on the Progestin only BCPs because of Oligomenorrhea in perimenopause.  Menstrual periods about every 2-3 months.  No pelvic pain.  No pain with IC. Vasectomy.  Marital issues, now  better.  Pap Neg 05/2020.  Pap reflex/Gono-Chlam today.  Breasts normal.  Mammo Neg 07/2022.  BMI increased to 30.33.  Will start exercising more.  Health labs with Fam MD.  Recommend Colono. - Cytology - PAP( [place])  2. Screen for STD (sexually transmitted disease) - Cytology - PAP( [place]) with Gono-Chlam  3. Relies on partner vasectomy for contraception  4. Perimenopausal - norethindrone (Peggy Montgomery) 0.35 MG tablet; Take 1 tablet (0.35 mg total) by mouth daily.  5. Class 1 obesity due to excess  calories with serious comorbidity and body mass index (BMI) of 30.0 to 30.9 in adult Lower calorie/carb diet.  Increase fitness activities.  Other orders - citalopram (CELEXA) 10 MG tablet; Take 10 mg by mouth daily. - lisinopril (ZESTRIL) 40 MG tablet; Take 40 mg by mouth daily. - hydrochlorothiazide (HYDRODIURIL) 12.5 MG tablet; Take 12.5 mg by mouth daily. - potassium chloride (KLOR-CON M) 10 MEQ tablet; Take 10 mEq by mouth daily.   Princess Bruins MD, 10:02 AM 09/05/2022

## 2022-09-10 LAB — CYTOLOGY - PAP
Chlamydia: NEGATIVE
Comment: NEGATIVE
Comment: NORMAL
Diagnosis: NEGATIVE
Diagnosis: REACTIVE
Neisseria Gonorrhea: NEGATIVE

## 2023-07-15 ENCOUNTER — Encounter (INDEPENDENT_AMBULATORY_CARE_PROVIDER_SITE_OTHER): Payer: Self-pay | Admitting: *Deleted

## 2023-07-24 DIAGNOSIS — R92323 Mammographic fibroglandular density, bilateral breasts: Secondary | ICD-10-CM | POA: Diagnosis not present

## 2023-07-24 DIAGNOSIS — Z1231 Encounter for screening mammogram for malignant neoplasm of breast: Secondary | ICD-10-CM | POA: Diagnosis not present

## 2023-09-16 ENCOUNTER — Other Ambulatory Visit: Payer: Self-pay | Admitting: Obstetrics & Gynecology

## 2023-09-16 DIAGNOSIS — N951 Menopausal and female climacteric states: Secondary | ICD-10-CM

## 2023-09-16 NOTE — Telephone Encounter (Signed)
Med refill request: norethindrone (micronor) Last AEX: 09/05/22 Next AEX: not scheduled - sent msg to front desk to schedule next annual visit. Last MMG (if hormonal med) 02/25/2019 Refill authorized: Last Rx sent #[phone] with 4 refills on 09/05/22. Please approve or deny as appropriate.

## 2023-09-22 ENCOUNTER — Telehealth (INDEPENDENT_AMBULATORY_CARE_PROVIDER_SITE_OTHER): Payer: Self-pay | Admitting: *Deleted

## 2023-09-22 NOTE — Telephone Encounter (Signed)
Who is your primary care physician: Shelbie Proctor  Reasons for the colonoscopy: Screening  Have you had a colonoscopy before?  no  Do you have family history of colon cancer? no  Previous colonoscopy with polyps removed? no  Do you have a history colorectal cancer?   no  Are you diabetic? If yes, Type 1 or Type 2?    no  Do you have a prosthetic or mechanical heart valve? no  Do you have a pacemaker/defibrillator?   no  Have you had endocarditis/atrial fibrillation? no  Have you had joint replacement within the last 12 months?  no  Do you tend to be constipated or have to use laxatives? sometimes  Do you have any history of drugs or alchohol?  no  Do you use supplemental oxygen?  no  Have you had a stroke or heart attack within the last 6 months? no  Do you take weight loss medication?  no  For female patients: have you had a hysterectomy?  no                                     are you post menopausal?       no                                            do you still have your menstrual cycle? yes      Do you take any blood-thinning medications such as: (aspirin, warfarin, Plavix, Aggrenox)  no  If yes we need the name, milligram, dosage and who is prescribing doctor  Current Outpatient Medications on File Prior to Visit  Medication Sig Dispense Refill   hydrochlorothiazide (HYDRODIURIL) 25 MG tablet Take 25 mg by mouth daily.     levothyroxine (SYNTHROID) 100 MCG tablet Take 100 mcg by mouth every morning.     lisinopril (ZESTRIL) 40 MG tablet Take 40 mg by mouth daily.     norethindrone (MICRONOR) 0.35 MG tablet TAKE 1 TABLET(0.35 MG) BY MOUTH DAILY 84 tablet 4   omeprazole (PRILOSEC) 20 MG capsule Take 20 mg by mouth daily.     Current Facility-Administered Medications on File Prior to Visit  Medication Dose Route Frequency Provider Last Rate Last Admin   gadopentetate dimeglumine (MAGNEVIST) injection 15 mL  15 mL Intravenous Once PRN Penumalli, Glenford Bayley, MD         No Known Allergies   Pharmacy: [facility] [place]  Primary Insurance Name: BCBS  Best number where you can be reached: [phone]

## 2023-09-22 NOTE — Telephone Encounter (Signed)
Room 1 Thanks

## 2023-09-23 NOTE — Telephone Encounter (Signed)
CALLED PT. No dates available in December worked for patient. She would like a call once we receive January schedule.

## 2023-10-22 NOTE — Telephone Encounter (Addendum)
Called pt, no answer and VM full to schedule TCS with Dr. Levon Hedger

## 2023-10-22 NOTE — Telephone Encounter (Signed)
Attempt to contact but voicemail is full.

## 2023-11-06 ENCOUNTER — Encounter (INDEPENDENT_AMBULATORY_CARE_PROVIDER_SITE_OTHER): Payer: Self-pay

## 2023-11-06 NOTE — Telephone Encounter (Signed)
Letter mailed to patient.

## 2024-01-13 ENCOUNTER — Encounter (INDEPENDENT_AMBULATORY_CARE_PROVIDER_SITE_OTHER): Payer: Self-pay | Admitting: *Deleted

## 2024-04-14 DIAGNOSIS — M25512 Pain in left shoulder: Secondary | ICD-10-CM | POA: Diagnosis not present

## 2024-05-04 DIAGNOSIS — M7502 Adhesive capsulitis of left shoulder: Secondary | ICD-10-CM | POA: Diagnosis not present

## 2024-05-04 DIAGNOSIS — M25512 Pain in left shoulder: Secondary | ICD-10-CM | POA: Diagnosis not present

## 2024-05-11 DIAGNOSIS — M7502 Adhesive capsulitis of left shoulder: Secondary | ICD-10-CM | POA: Diagnosis not present

## 2024-05-11 DIAGNOSIS — M25512 Pain in left shoulder: Secondary | ICD-10-CM | POA: Diagnosis not present

## 2024-05-25 DIAGNOSIS — M7502 Adhesive capsulitis of left shoulder: Secondary | ICD-10-CM | POA: Diagnosis not present

## 2024-05-25 DIAGNOSIS — M25512 Pain in left shoulder: Secondary | ICD-10-CM | POA: Diagnosis not present

## 2024-05-26 DIAGNOSIS — M7502 Adhesive capsulitis of left shoulder: Secondary | ICD-10-CM | POA: Diagnosis not present

## 2024-06-01 DIAGNOSIS — M7502 Adhesive capsulitis of left shoulder: Secondary | ICD-10-CM | POA: Diagnosis not present

## 2024-06-01 DIAGNOSIS — M25512 Pain in left shoulder: Secondary | ICD-10-CM | POA: Diagnosis not present

## 2024-07-07 DIAGNOSIS — M7502 Adhesive capsulitis of left shoulder: Secondary | ICD-10-CM | POA: Diagnosis not present

## 2024-07-29 ENCOUNTER — Encounter (INDEPENDENT_AMBULATORY_CARE_PROVIDER_SITE_OTHER): Payer: Self-pay | Admitting: *Deleted

## 2024-08-03 DIAGNOSIS — Z1231 Encounter for screening mammogram for malignant neoplasm of breast: Secondary | ICD-10-CM | POA: Diagnosis not present

## 2024-08-03 DIAGNOSIS — R92323 Mammographic fibroglandular density, bilateral breasts: Secondary | ICD-10-CM | POA: Diagnosis not present

## 2024-10-12 ENCOUNTER — Ambulatory Visit: Admission: EM | Admit: 2024-10-12 | Discharge: 2024-10-12 | Disposition: A | Discharge status: Home / Self Care

## 2024-10-12 ENCOUNTER — Ambulatory Visit

## 2024-10-12 DIAGNOSIS — J209 Acute bronchitis, unspecified: Secondary | ICD-10-CM

## 2024-10-12 DIAGNOSIS — R059 Cough, unspecified: Secondary | ICD-10-CM | POA: Diagnosis not present

## 2024-10-12 DIAGNOSIS — H65191 Other acute nonsuppurative otitis media, right ear: Secondary | ICD-10-CM | POA: Diagnosis not present

## 2024-10-12 DIAGNOSIS — J40 Bronchitis, not specified as acute or chronic: Secondary | ICD-10-CM | POA: Diagnosis not present

## 2024-10-12 MED ORDER — METHYLPREDNISOLONE SODIUM SUCC 125 MG IJ SOLR
125.0000 mg | Freq: Once | INTRAMUSCULAR | Status: AC
  Administered 2024-10-12: 125 mg via INTRAMUSCULAR

## 2024-10-12 MED ORDER — ALBUTEROL SULFATE HFA 108 (90 BASE) MCG/ACT IN AERS: 2.0000 | INHALATION_SPRAY | Freq: Four times a day (QID) | RESPIRATORY_TRACT | 0 refills | Status: AC | PRN

## 2024-10-12 MED ORDER — AZITHROMYCIN 250 MG PO TABS: 250.0000 mg | ORAL_TABLET | Freq: Every day | ORAL | 0 refills | Status: AC

## 2024-10-12 MED ORDER — PREDNISONE 20 MG PO TABS: 40.0000 mg | ORAL_TABLET | Freq: Every day | ORAL | 0 refills | Status: AC

## 2024-10-12 MED ORDER — HYDROCODONE BIT-HOMATROP MBR 5-1.5 MG/5ML PO SOLN: 5.0000 mL | Freq: Four times a day (QID) | ORAL | 0 refills | Status: AC | PRN

## 2024-10-12 NOTE — ED Triage Notes (Signed)
 Pt  reports she has a cough that stabs her throat, bilateral ear pressure, sore throat x 3 weeks    Took vicks, mucinex, ibuprofen, and nasal spray

## 2024-10-12 NOTE — Discharge Instructions (Signed)
 The chest x-ray was negative, but does show that you do have bronchitis. You were given an injection of Solu-Medrol 125 mg.  Start the prednisone tomorrow. Take medication as prescribed. Increase fluids and allow for plenty of rest. You may take over-the-counter Tylenol as needed for pain, fever, or general discomfort. Recommend the use of a humidifier in your bedroom at nighttime during sleep and sleeping elevated on pillows while cough symptoms persist. As discussed, when she complete the medications, if you continue to have a persistent nagging cough, but are generally feeling well, continue over-the-counter cough medications, fluids, and cough drops.  Seek care if you develop new symptoms of fever, chills, wheezing, shortness of breath, or difficulty breathing. Follow-up as needed.

## 2024-10-12 NOTE — ED Provider Notes (Signed)
 [facility]    CSN: [id number] Arrival date & time: 10/12/24  1340      History   Chief Complaint No chief complaint on file.   HPI Peggy Montgomery is a 53 y.o. female.   The history is provided by the patient.   Patient presents for a 3-week history of cough, sore throat, and bilateral ear pressure.  Patient states over the past week, her cough has worsened.  States that she did have a fever approximately 1 week ago.  Patient states that she has been coughing so much that her throat is now sore.  Patient states the cough is worse at night.  She states that it feels like the cough stabs her throat.  Patient denies chills, headache, ear drainage, wheezing, difficulty breathing, chest pain, abdominal pain, nausea, vomiting, diarrhea, or rash.  Patient denies further history of smoking or asthma.  States she has been taking Mucinex, ibuprofen, using nasal spray, and Vicks for her symptoms with minimal relief.  Cough has been persistent since the patient's arrival to this appointment.  Past Medical History:  Diagnosis Date   Hypertension    IBS (irritable bowel syndrome)    Thyroid  disease     Patient Active Problem List   Diagnosis Date Noted   Paresthesia 04/21/2017   ALLERGIC RHINITIS, SEASONAL 07/13/2009   SUPERFICIAL INJURY OF CORNEA 07/21/2008   Arthropathy, multiple sites 11/09/2007   Overweight (BMI 25.0-29.9) 06/18/2007   VARICOSE VEIN 06/18/2007   HYPERLIPIDEMIA 10/16/2006   Anxiety state 10/16/2006   DEPRESSION 10/16/2006   Essential hypertension 10/16/2006   Constipation 10/16/2006   IBS 10/16/2006   Severe pre-eclampsia 10/16/2006    Past Surgical History:  Procedure Laterality Date   CESAREAN SECTION     LEG SURGERY      OB History     Gravida  1   Para  1   Term  1   Preterm      AB      Living  1      SAB      IAB      Ectopic      Multiple      Live Births               Home Medications    Prior to Admission  medications   Medication Sig Start Date End Date Taking? Authorizing Provider  albuterol  (VENTOLIN  HFA) 108 (90 Base) MCG/ACT inhaler Inhale 2 puffs into the lungs every 6 (six) hours as needed. 10/12/24  Yes Leath-Warren, Etta PARAS, NP  amLODipine (NORVASC) 2.5 MG tablet Take 1 tablet every day by oral route for 90 days. 07/28/24  Yes [provider]  azithromycin  (ZITHROMAX ) 250 MG tablet Take 1 tablet (250 mg total) by mouth daily. Take first 2 tablets together, then 1 every day until finished. 10/12/24  Yes Leath-Warren, Etta PARAS, NP  citalopram (CELEXA) 10 MG tablet Take 10 mg by mouth every morning. 09/16/24  Yes [provider]  HYDROcodone  bit-homatropine (HYCODAN) 5-1.5 MG/5ML syrup Take 5 mLs by mouth every 6 (six) hours as needed for cough. 10/12/24  Yes Leath-Warren, Etta PARAS, NP  metFORMIN (GLUCOPHAGE-XR) 500 MG 24 hr tablet Take 1 tablet every day by oral route for 90 days. 07/28/24  Yes [provider]  potassium chloride  (KLOR-CON ) 20 MEQ packet USE 1 PACKET BY MOUTH EVERY OTHER DAY 08/30/24  Yes [provider]  predniSONE  (DELTASONE ) 20 MG tablet Take 2 tablets (40 mg total) by  mouth daily with breakfast for 5 days. 10/12/24 10/17/24 Yes Leath-Warren, Etta PARAS, NP  hydrochlorothiazide (HYDRODIURIL) 25 MG tablet Take 25 mg by mouth daily. 08/13/22   [provider]  levothyroxine (SYNTHROID) 100 MCG tablet Take 100 mcg by mouth every morning. 04/25/21   [provider]  lisinopril (ZESTRIL) 40 MG tablet Take 40 mg by mouth daily. 08/21/22   [provider]  norethindrone  (MICRONOR ) 0.35 MG tablet TAKE 1 TABLET(0.35 MG) BY MOUTH DAILY 09/16/23   Chrzanowski, Jami B, NP  omeprazole (PRILOSEC) 20 MG capsule Take 20 mg by mouth daily.    [provider]    Family History Family History  Problem Relation Age of Onset   Thyroid  disease Mother    High blood pressure Mother    COPD Mother    Hypertension Mother     Esophageal cancer Father    Cancer Father        esophageal    Hypertension Maternal Grandmother    Cancer Paternal Grandmother        lung     Social History Social History   Tobacco Use   Smoking status: Former    Current packs/day: 0.00    Types: Cigarettes    Quit date: 12/04/1990    Years since quitting: 33.8   Smokeless tobacco: Never  Vaping Use   Vaping status: Never Used  Substance Use Topics   Alcohol use: Not Currently   Drug use: No     Allergies   Patient has no known allergies.   Review of Systems Review of Systems Per HPI  Physical Exam Triage Vital Signs ED Triage Vitals  Encounter Vitals Group     BP 10/12/24 1434 127/79     Girls Systolic BP Percentile --      Girls Diastolic BP Percentile --      Boys Systolic BP Percentile --      Boys Diastolic BP Percentile --      Pulse Rate 10/12/24 1434 66     Resp 10/12/24 1434 18     Temp 10/12/24 1434 98.7 F (37.1 C)     Temp Source 10/12/24 1434 Oral     SpO2 10/12/24 1434 95 %     Weight --      Height --      Head Circumference --      Peak Flow --      Pain Score 10/12/24 1433 5     Pain Loc --      Pain Education --      Exclude from Growth Chart --    No data found.  Updated Vital Signs BP 127/79 (BP Location: Right Arm)   Pulse 66   Temp 98.7 F (37.1 C) (Oral)   Resp 18   LMP 02/10/2020 Comment: pill  SpO2 95%   Visual Acuity Right Eye Distance:   Left Eye Distance:   Bilateral Distance:    Right Eye Near:   Left Eye Near:    Bilateral Near:     Physical Exam Vitals and nursing note reviewed.  Constitutional:      General: She is not in acute distress.    Appearance: Normal appearance.  HENT:     Head: Normocephalic.     Right Ear: Ear canal and external ear normal. A middle ear effusion is present.     Left Ear: Tympanic membrane, ear canal and external ear normal.     Nose: Nose normal.  Mouth/Throat:     Mouth: Mucous membranes are moist.  Eyes:      Extraocular Movements: Extraocular movements intact.     Conjunctiva/sclera: Conjunctivae normal.     Pupils: Pupils are equal, round, and reactive to light.  Cardiovascular:     Rate and Rhythm: Normal rate and regular rhythm.     Pulses: Normal pulses.     Heart sounds: Normal heart sounds.  Pulmonary:     Effort: Pulmonary effort is normal. No respiratory distress.     Breath sounds: Normal breath sounds. No stridor. No wheezing, rhonchi or rales.  Abdominal:     General: Bowel sounds are normal.     Palpations: Abdomen is soft.     Tenderness: There is no abdominal tenderness.  Musculoskeletal:     Cervical back: Normal range of motion.  Lymphadenopathy:     Cervical: No cervical adenopathy.  Skin:    General: Skin is warm and dry.  Neurological:     General: No focal deficit present.     Mental Status: She is alert and oriented to person, place, and time.  Psychiatric:        Mood and Affect: Mood normal.        Behavior: Behavior normal.      UC Treatments / Results  Labs (all labs ordered are listed, but only abnormal results are displayed) Labs Reviewed - No data to display  EKG   Radiology DG Chest 2 View Result Date: 10/12/2024 CLINICAL DATA:  Cough for the past 3 weeks. EXAM: CHEST - 2 VIEW COMPARISON:  10/25/2013 FINDINGS: Normal sized heart. Clear lungs with normal vascularity. Mild peribronchial thickening. Upper lumbar spine degenerative changes. IMPRESSION: Mild bronchitic changes. Electronically Signed   By: Elspeth Bathe M.D.   On: 10/12/2024 15:39    Procedures Procedures (including critical care time)  Medications Ordered in UC Medications  methylPREDNISolone sodium succinate (SOLU-MEDROL) 125 mg/2 mL injection 125 mg (125 mg Intramuscular Given 10/12/24 1542)    Initial Impression / Assessment and Plan / UC Course  I have reviewed the triage vital signs and the nursing notes.  Pertinent labs & imaging results that were available during my care  of the patient were reviewed by me and considered in my medical decision making (see chart for details).  X-ray shows mild bronchitic changes, no active cardiopulmonary disease.  On exam, the patient's lung sounds are clear throughout, room air sats are at 95%.  Patient is well-appearing, she is in mild distress due to the persistent cough.  Symptoms consistent with acute bronchitis.  Solu-Medrol 125 mg IM administered for bronchial inflammation.  Given the ongoing persistence of the cough and the duration of the patient's symptoms, will cover with azithromycin 250 mg.  Will also treat with prednisone 40 mg for the next 5 days, Hycodan cough syrup, and an albuterol inhaler.  Supportive care recommendations were provided and discussed with the patient to include fluids, rest, over-the-counter analgesics, sleeping elevated, and use of a humidifier at nighttime during sleep.  Discussed indications with the patient regarding follow-up.  Patient was in agreement with this plan of care and verbalizes understanding.  All questions were answered.  Patient stable for discharge.  Work note was provided.  Final Clinical Impressions(s) / UC Diagnoses   Final diagnoses:  Acute bronchitis, unspecified organism  Acute middle ear effusion, right     Discharge Instructions      The chest x-ray was negative, but does show that you do have  bronchitis. You were given an injection of Solu-Medrol 125 mg.  Start the prednisone tomorrow. Take medication as prescribed. Increase fluids and allow for plenty of rest. You may take over-the-counter Tylenol as needed for pain, fever, or general discomfort. Recommend the use of a humidifier in your bedroom at nighttime during sleep and sleeping elevated on pillows while cough symptoms persist. As discussed, when she complete the medications, if you continue to have a persistent nagging cough, but are generally feeling well, continue over-the-counter cough medications, fluids,  and cough drops.  Seek care if you develop new symptoms of fever, chills, wheezing, shortness of breath, or difficulty breathing. Follow-up as needed.     ED Prescriptions     Medication Sig Dispense Auth. Provider   HYDROcodone bit-homatropine (HYCODAN) 5-1.5 MG/5ML syrup Take 5 mLs by mouth every 6 (six) hours as needed for cough. 120 mL Leath-Warren, Etta PARAS, NP   albuterol (VENTOLIN HFA) 108 (90 Base) MCG/ACT inhaler Inhale 2 puffs into the lungs every 6 (six) hours as needed. 8 g Leath-Warren, Etta PARAS, NP   azithromycin (ZITHROMAX) 250 MG tablet Take 1 tablet (250 mg total) by mouth daily. Take first 2 tablets together, then 1 every day until finished. 6 tablet Leath-Warren, Etta PARAS, NP   predniSONE (DELTASONE) 20 MG tablet Take 2 tablets (40 mg total) by mouth daily with breakfast for 5 days. 10 tablet Leath-Warren, Etta PARAS, NP      PDMP not reviewed this encounter.   [place] Etta PARAS, NP 10/12/24 1549
